# Patient Record
Sex: Male | Born: 1970 | Race: White | Hispanic: No | Smoking: Never smoker
Health system: Southern US, Academic
[De-identification: ages and names within clinical notes are randomized; demographics above are authoritative.]

## PROBLEM LIST (undated history)

## (undated) DIAGNOSIS — K5792 Diverticulitis of intestine, part unspecified, without perforation or abscess without bleeding: Secondary | ICD-10-CM

## (undated) DIAGNOSIS — K859 Acute pancreatitis without necrosis or infection, unspecified: Secondary | ICD-10-CM

## (undated) DIAGNOSIS — I10 Essential (primary) hypertension: Secondary | ICD-10-CM

## (undated) DIAGNOSIS — E119 Type 2 diabetes mellitus without complications: Secondary | ICD-10-CM

---

## 1992-04-13 ENCOUNTER — Other Ambulatory Visit (HOSPITAL_COMMUNITY): Payer: Self-pay | Admitting: EXTERNAL

## 2021-02-16 IMAGING — CR XRAY SHOULDER MINIMUM 2 VIEW LT
1 series · 3 of 3 positions shown · non-contrast
Comparison: None available.

﻿EXAM:  XRAY SHOULDER MINIMUM 2 VIEW LT
INDICATION: Pain.

[Series 1: view not recorded · 0.17mm/px · 3 of 3 slices shown]
[im 1/3]
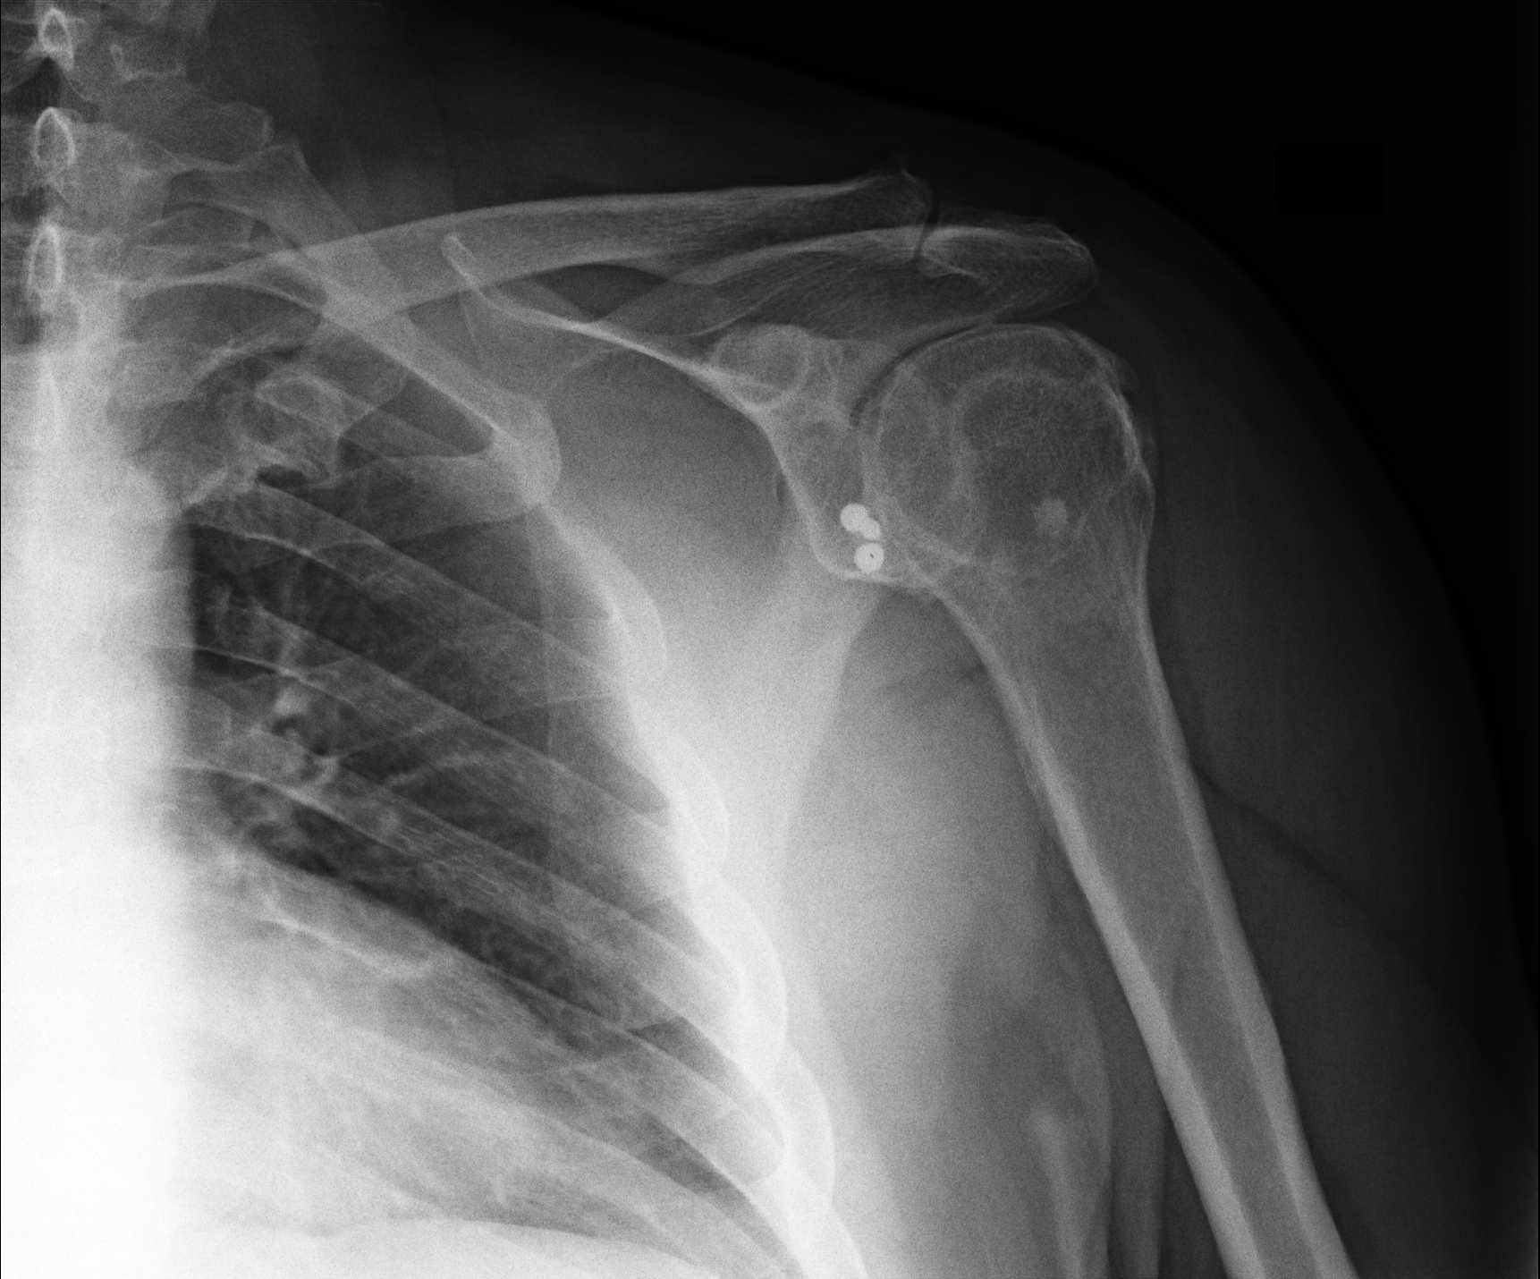
[im 2/3]
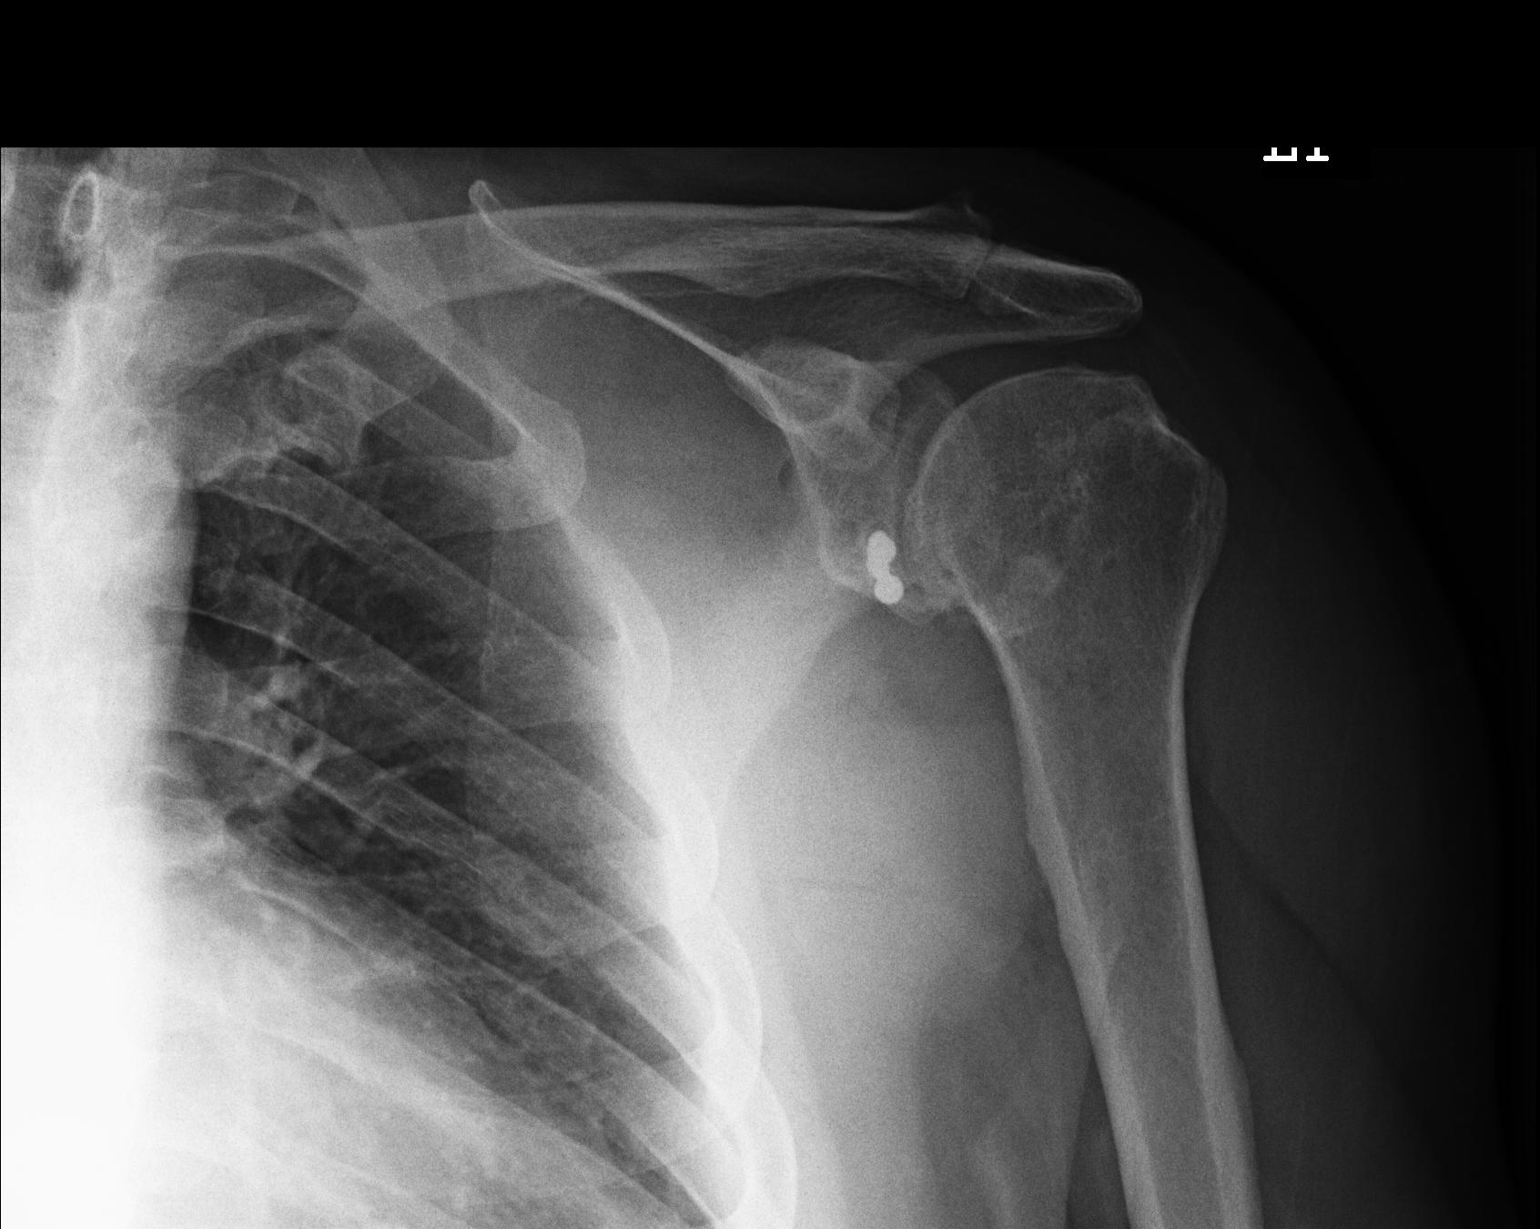
[im 3/3]
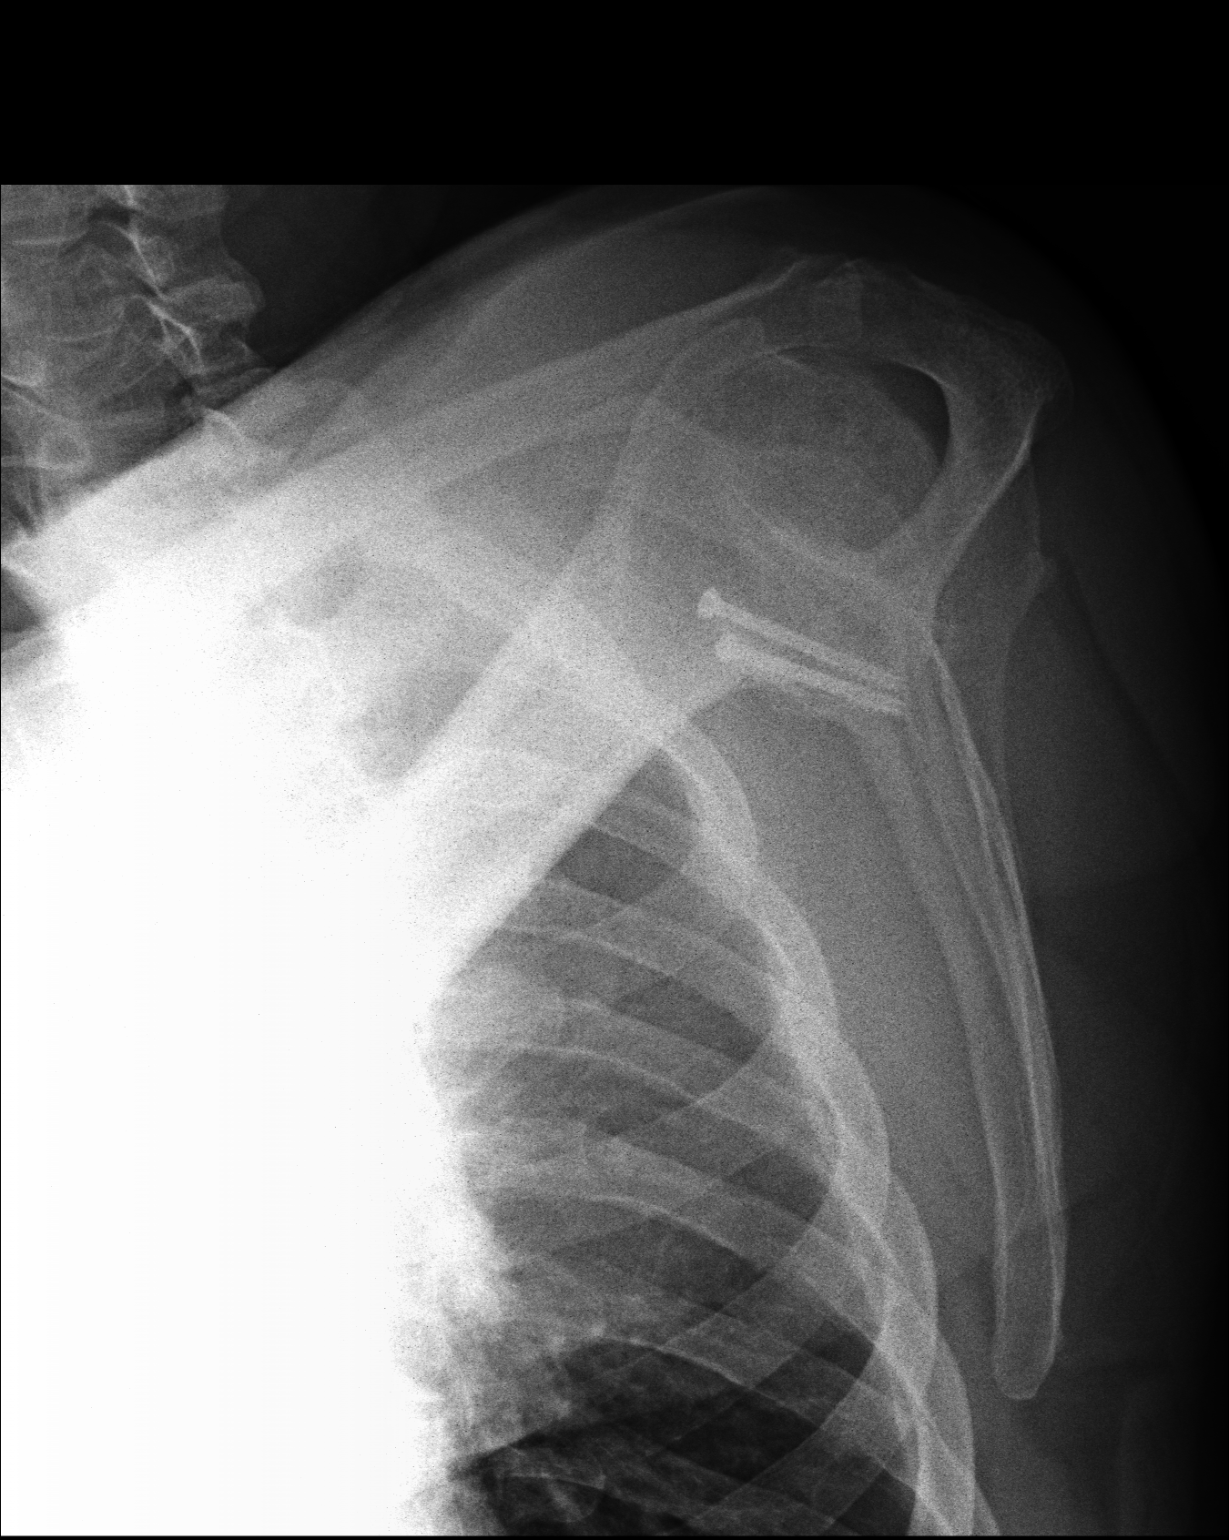

[3 of 3 positions shown; findings below may reference images not displayed]

FINDINGS: There is no acute fracture or subluxation. There is advanced glenohumeral and acromioclavicular joint osteoarthritis. Surgical anchors are also noted within the inferior glenoid. Visualized lung is clear. Surrounding soft tissues are unremarkable.
IMPRESSION: Advanced degenerative and postsurgical changes, no acute osseous abnormality.

## 2021-02-16 IMAGING — CR XRAY CERVICAL SPINE MINIMUM 4 VIEWS
1 series · 5 of 5 positions shown · non-contrast
Comparison: None available.

﻿EXAM:  [id number]      XRAY CERVICAL SPINE MINIMUM 4 VIEWS
INDICATION: Neck pain.

[Series 1: view not recorded · 0.17mm/px · 5 of 5 slices shown]
[im 1/5]
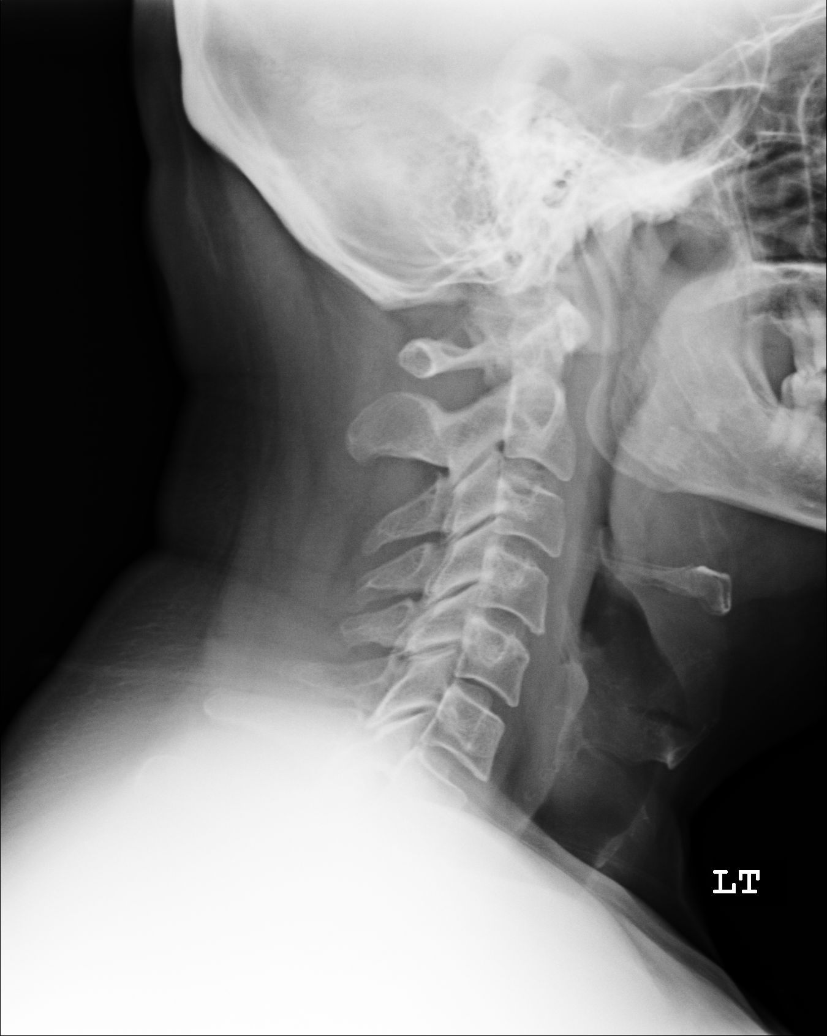
[im 2/5]
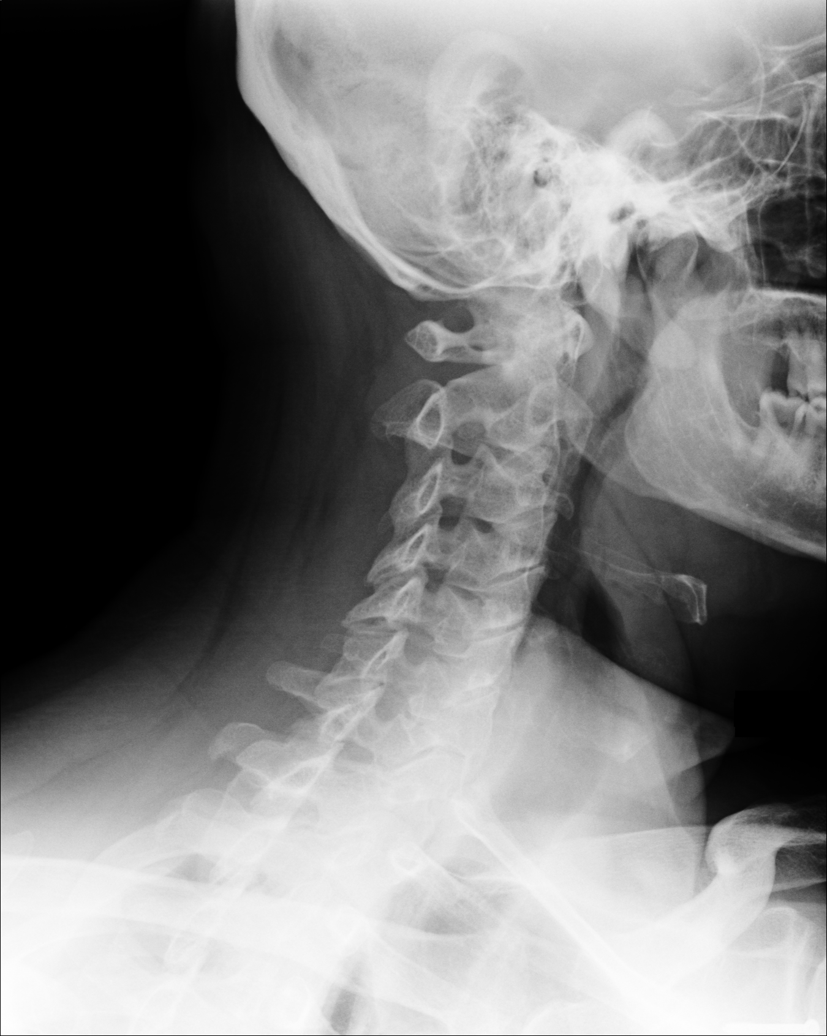
[im 3/5]
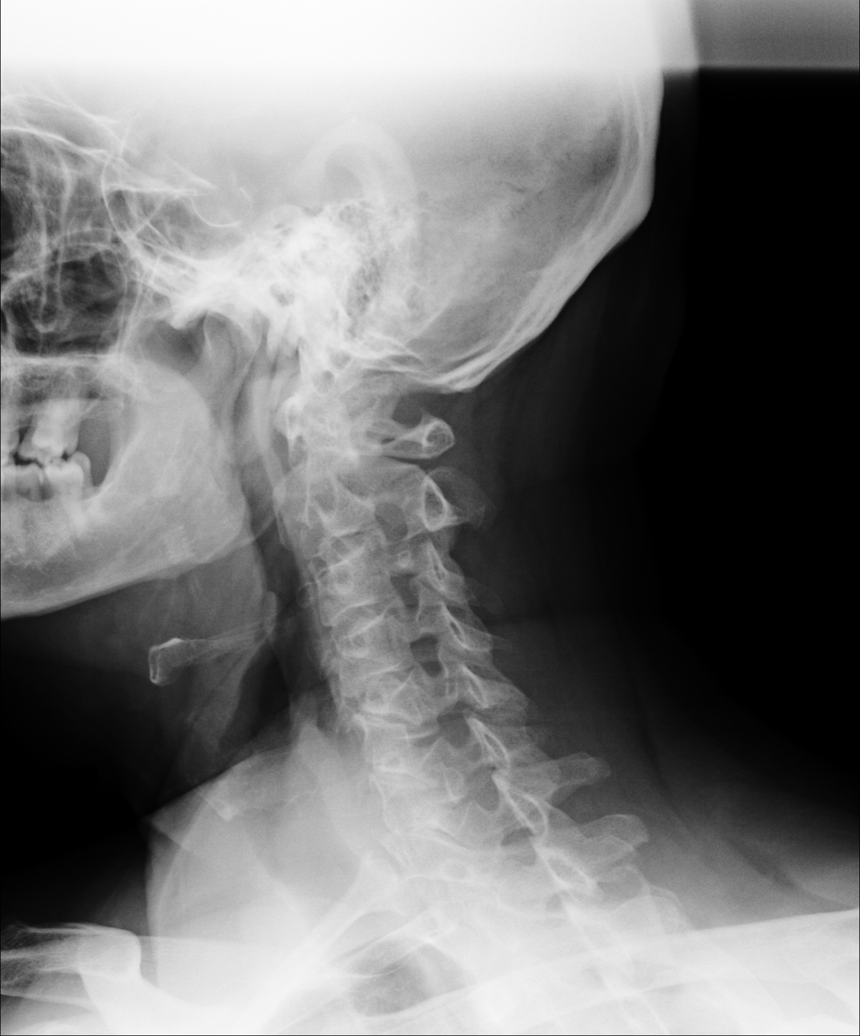
[im 4/5]
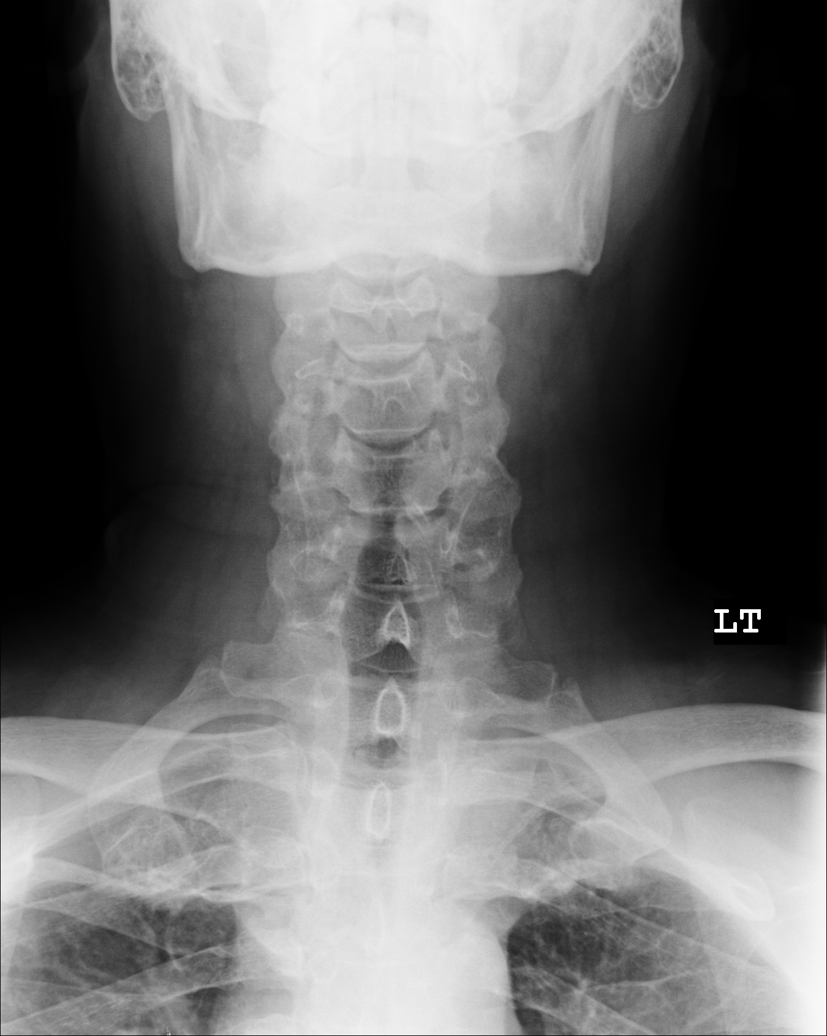
[im 5/5]
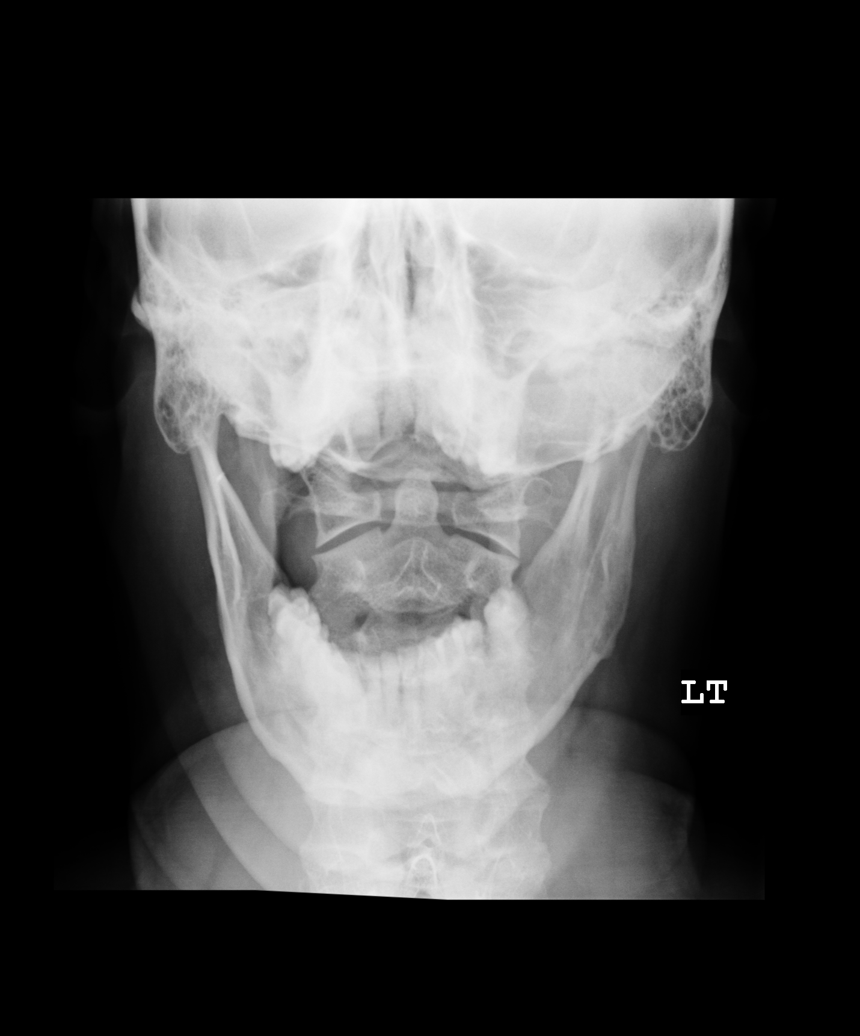

[5 of 5 positions shown; findings below may reference images not displayed]

FINDINGS: Straightening of cervical lordosis is likely positional. There is no acute fracture or subluxation. No significant degenerative disc disease or neural foraminal stenosis is seen at any level. Atlantoaxial articulation is also well maintained. There is no prevertebral soft tissue swelling.
IMPRESSION: Unremarkable exam.

## 2021-06-20 IMAGING — MR MRI LUMBAR SPINE WITHOUT CONTRAST
5 of 6 series · 32 of 48 positions shown · IV contrast (gadolinium)
Comparison: None available.

﻿EXAM:  [id number]   MRI LUMBAR SPINE WITHOUT CONTRAST
INDICATION: Lower back pain with left lower extremity radiculopathy.
TECHNIQUE: Multiplanar multisequential MRI of the lumbar spine was performed without gadolinium contrast.

[Series 5: T2 · sagittal · 4.0mm · 0.87mm/px · 6 of 13 slices shown (1 of 3)]
[im 1/13]
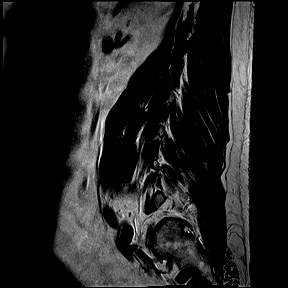
[im 3/13]
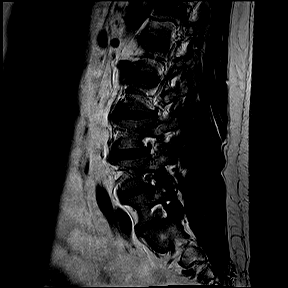
[im 5/13]
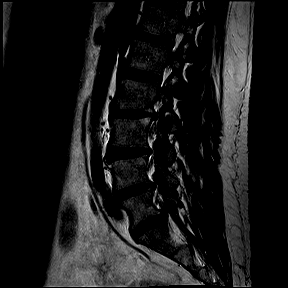
[im 8/13]
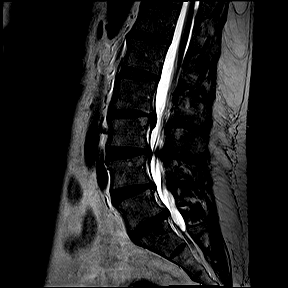
[im 10/13]
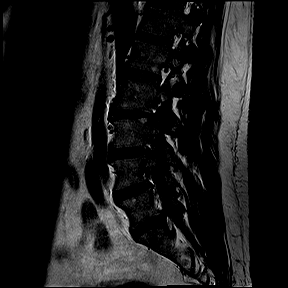
[im 13/13]
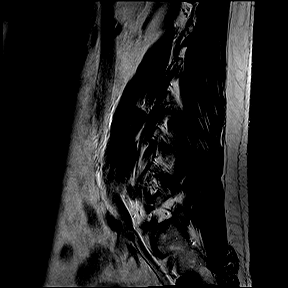

[Series 6: T1 · sagittal · 4.0mm · 0.87mm/px · 6 of 13 slices shown (1 of 2)]
[im 1/13]
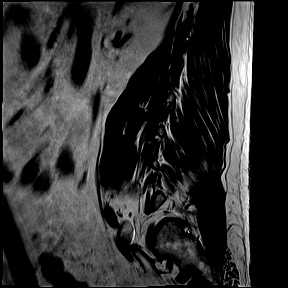
[im 3/13]
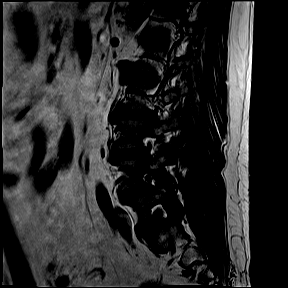
[im 5/13]
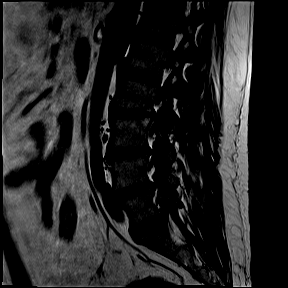
[im 8/13]
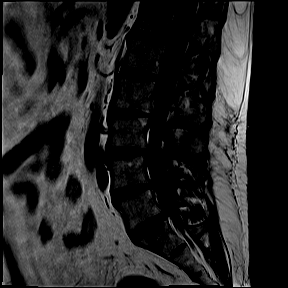
[im 10/13]
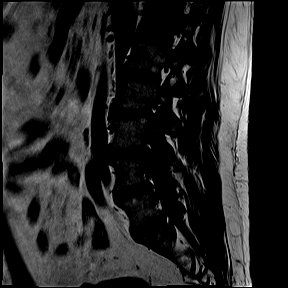
[im 13/13]
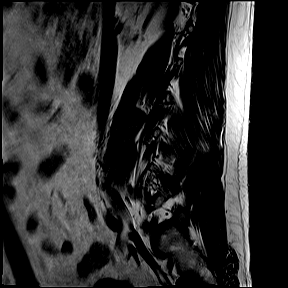

[Series 8: T2 · axial · 4.0mm · 0.52mm/px · z∈[-143,+64]mm · 11 of 23 slices shown (2 of 3)]
[im 1/23]
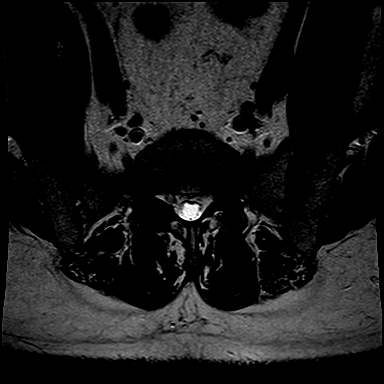
[im 3/23]
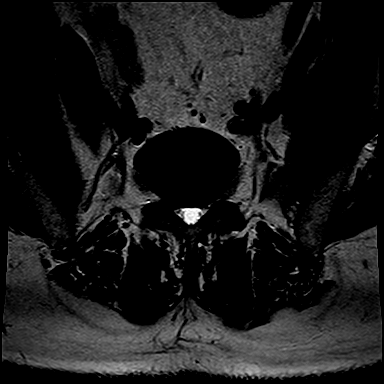
[im 5/23]
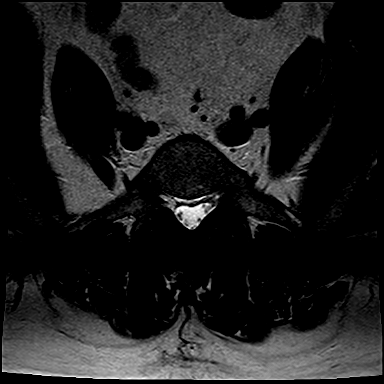
[im 7/23]
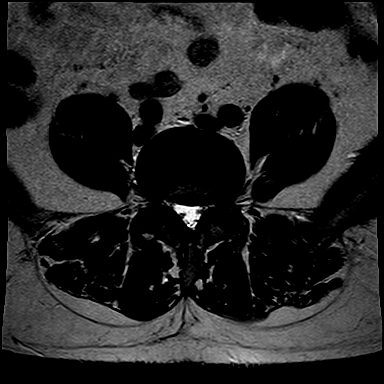
[im 9/23]
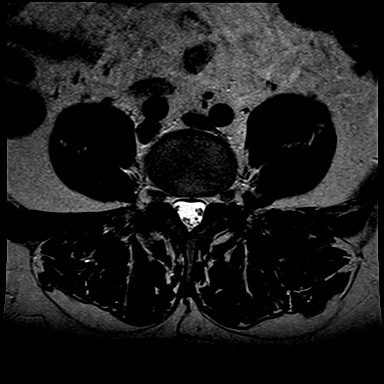
[im 12/23]
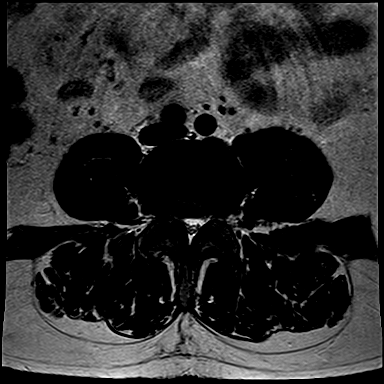
[im 14/23]
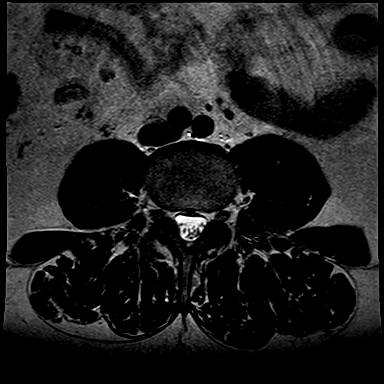
[im 16/23]
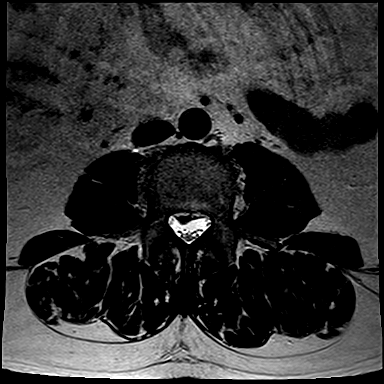
[im 18/23]
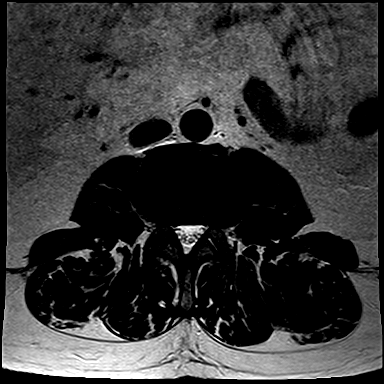
[im 20/23]
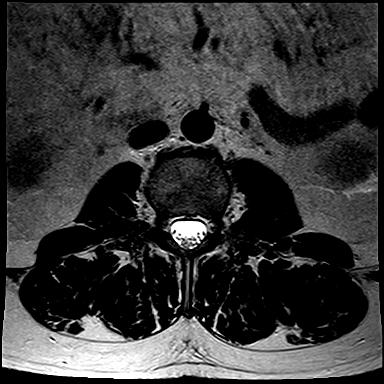
[im 23/23]
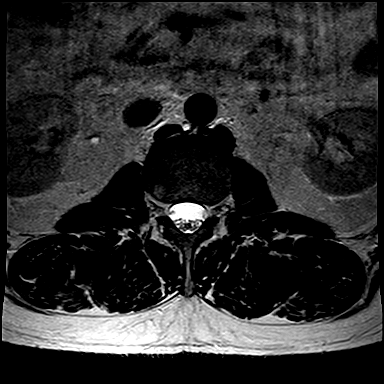

[Series 9: T1 · axial · 4.0mm · 0.52mm/px · 1 of 23 slices shown (2 of 2)]
[im 1/23]
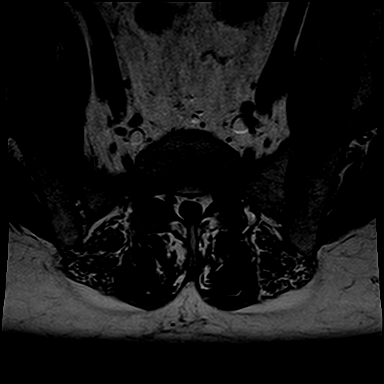

[Series 10: T2 · coronal · 5.0mm · 0.82mm/px · 8 of 18 slices shown (3 of 3)]
[im 1/18]
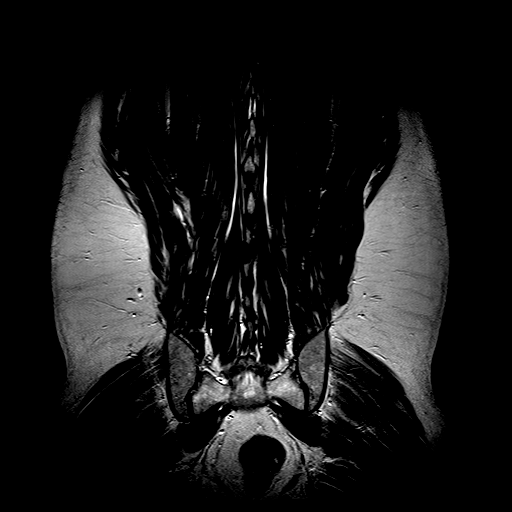
[im 3/18]
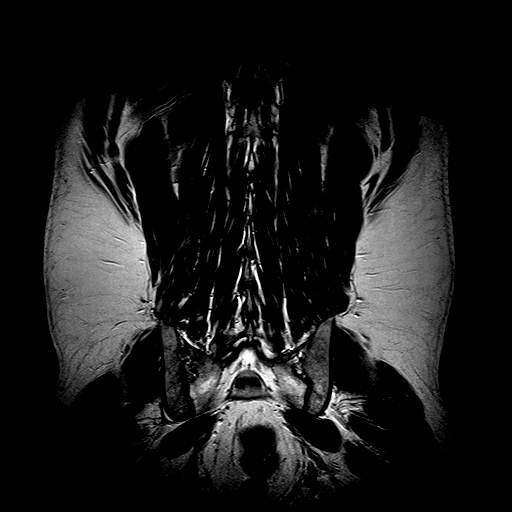
[im 5/18]
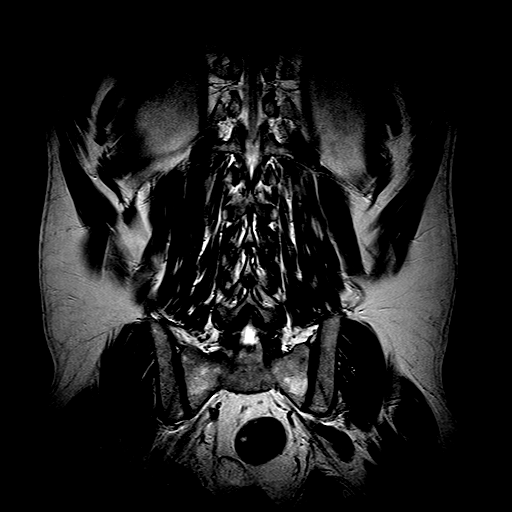
[im 8/18]
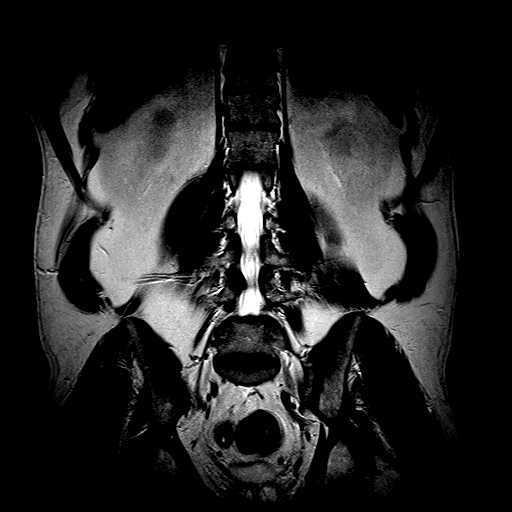
[im 10/18]
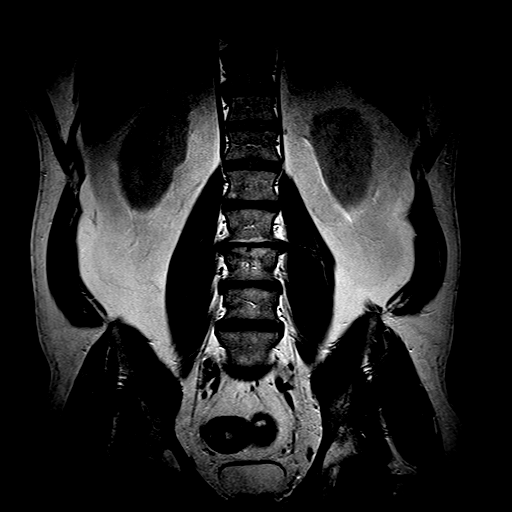
[im 13/18]
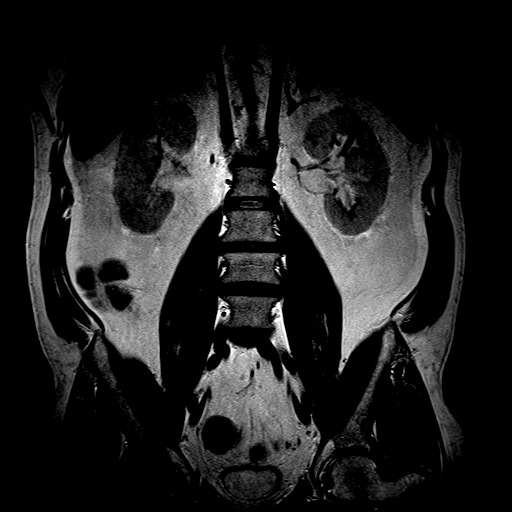
[im 15/18]
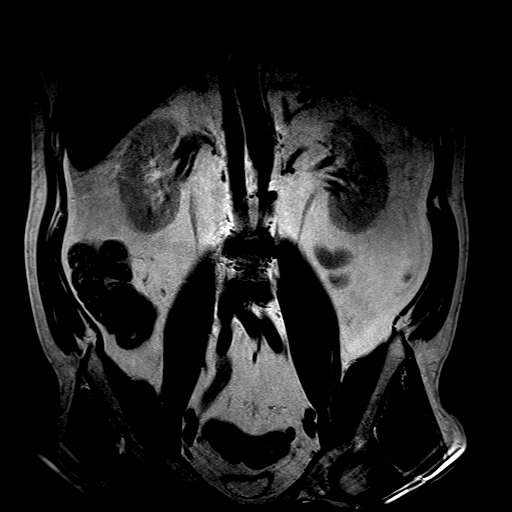
[im 18/18]
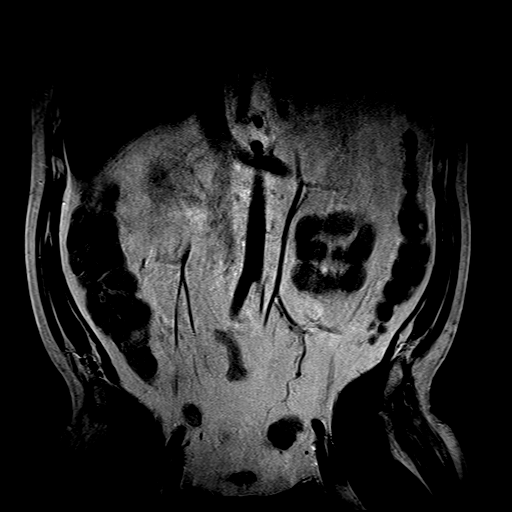

[32 of 48 positions shown; findings below may reference images not displayed]

FINDINGS: Vertebral bodies are normal in height, alignment and signal intensity. There is no acute fracture or subluxation. Distal spinal cord is normal in signal intensity and terminates normally at L1 vertebral body level. Spinal canal is congenitally narrow.

L1-2 level is unremarkable.

At L2-3 level, there is a small to moderate broad-based central and right paracentral disc bulge resulting in moderate to severe spinal stenosis. There is moderate bilateral neural foraminal stenosis from facet arthropathy and bulging annulus.

At L3-4 level, there is a small broad-based central disc bulge resulting in severe spinal stenosis. There is moderate bilateral neural foraminal stenosis from facet arthropathy and bulging annulus.

At L4-5 level, there is a small broad-based central disc bulge, mildly effacing the ventral thecal sac. There is moderate bilateral neural foraminal stenosis from facet arthropathy and bulging annulus.

At L5-S1 level, there is a small broad-based central disc bulge, mildly effacing the ventral thecal sac. There is moderate to severe left and moderate right neural foraminal stenosis from facet arthropathy and bulging annulus.

Paraspinal soft tissues are unremarkable.
IMPRESSION: 1. Severe spinal stenosis at L3-4 level from a small central disc bulge. 

2. Moderate to severe spinal stenosis at L2-3 level from a small to moderate central and right paracentral disc bulge. 

3. Multilevel neural foraminal stenosis as detailed above.

## 2022-04-24 IMAGING — MR MRI LUMBAR SPINE WITHOUT CONTRAST
5 of 6 series · 32 of 48 positions shown · non-contrast
Comparison: None available.

﻿EXAM:  [id number]   MRI LUMBAR SPINE WITHOUT CONTRAST
INDICATION: Low back pain radiating down left leg.
TECHNIQUE: Noncontrast multiplanar, multisequence MRI was performed.

[Series 5: T2 · sagittal · 4.0mm · 0.94mm/px · 5 of 13 slices shown (1 of 3)]
[im 1/13]
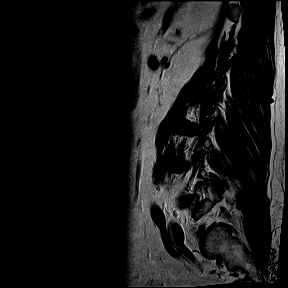
[im 4/13]
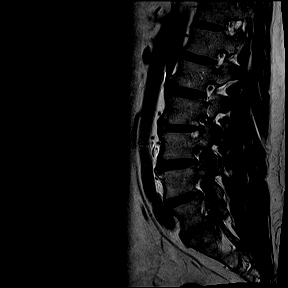
[im 7/13]
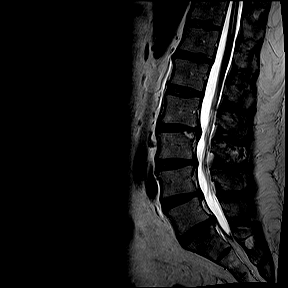
[im 10/13]
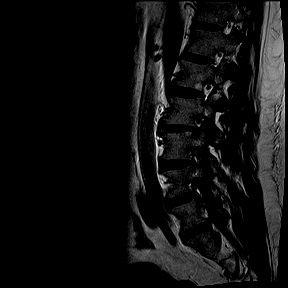
[im 13/13]
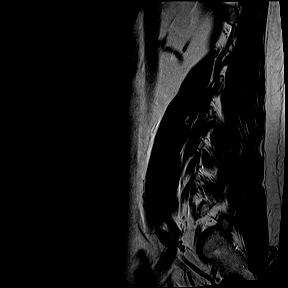

[Series 6: T1 · sagittal · 4.0mm · 0.94mm/px · 6 of 13 slices shown (1 of 2)]
[im 1/13]
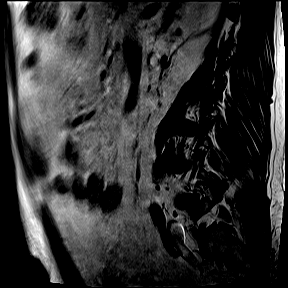
[im 3/13]
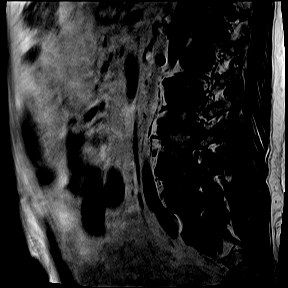
[im 5/13]
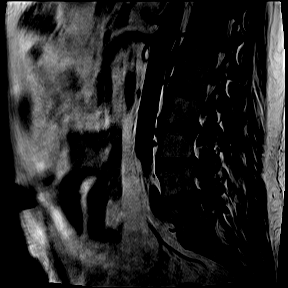
[im 8/13]
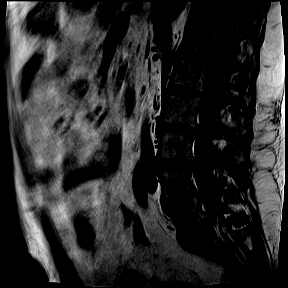
[im 10/13]
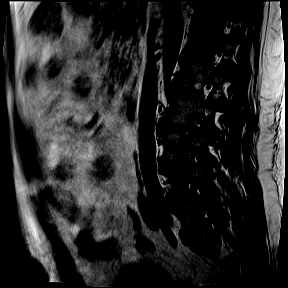
[im 13/13]
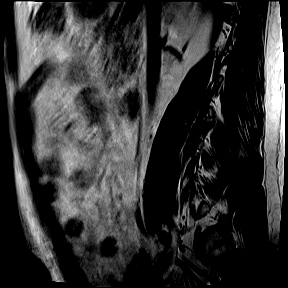

[Series 8: T2 · axial · 4.0mm · 0.52mm/px · z∈[-156,+43]mm · 11 of 23 slices shown (2 of 3)]
[im 1/23]
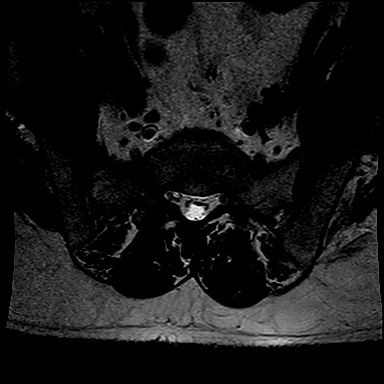
[im 3/23]
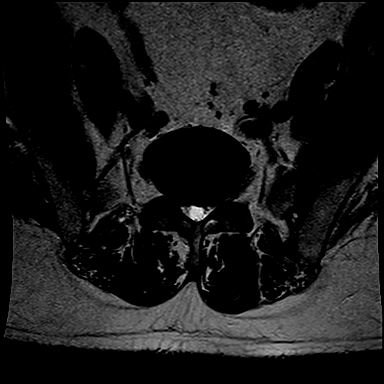
[im 5/23]
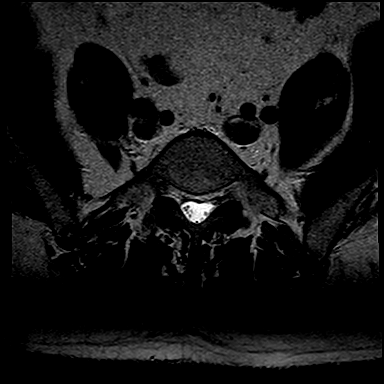
[im 7/23]
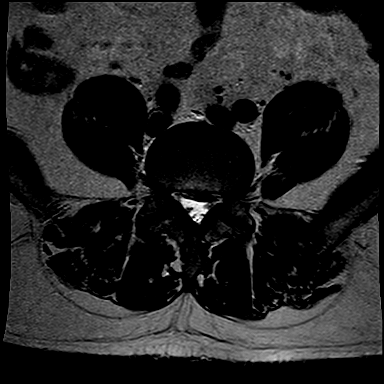
[im 9/23]
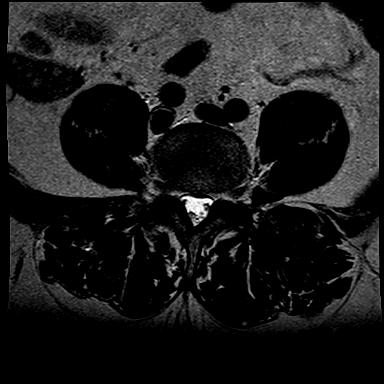
[im 12/23]
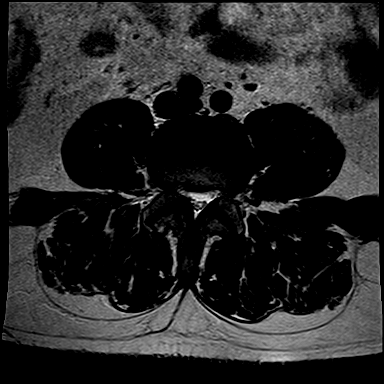
[im 14/23]
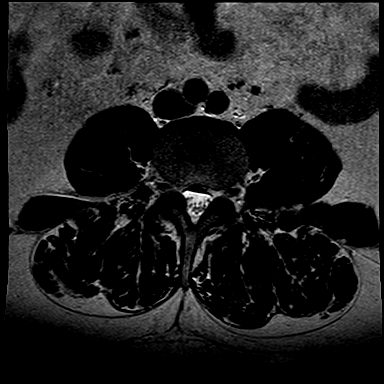
[im 16/23]
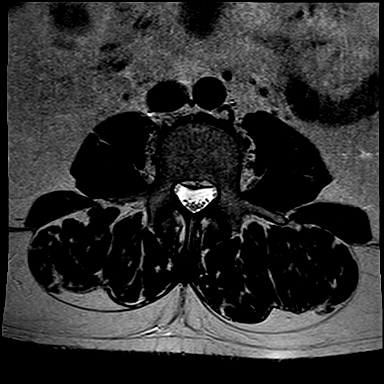
[im 18/23]
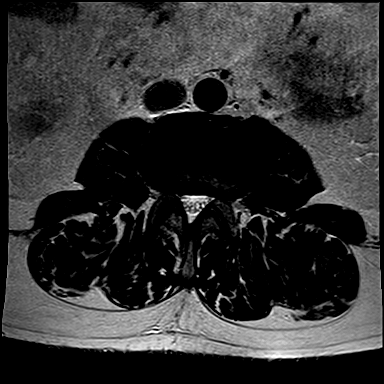
[im 20/23]
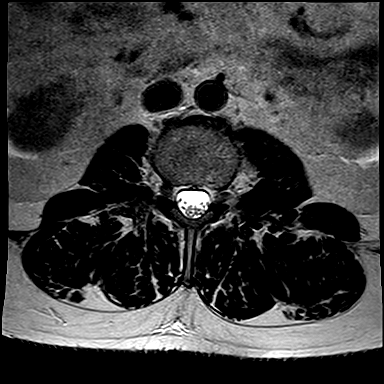
[im 23/23]
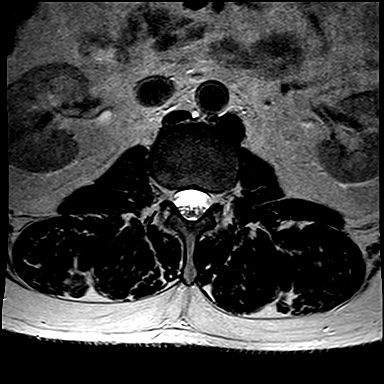

[Series 9: T1 · axial · 4.0mm · 0.52mm/px · 1 of 23 slices shown (2 of 2)]
[im 1/23]
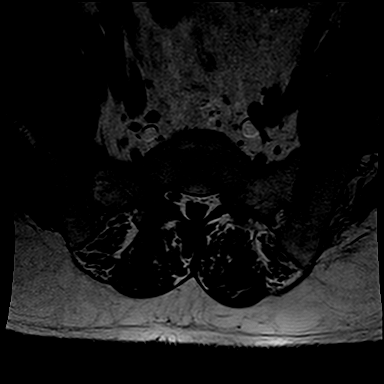

[Series 10: T2 · coronal · 5.0mm · 0.82mm/px · 9 of 20 slices shown (3 of 3)]
[im 1/20]
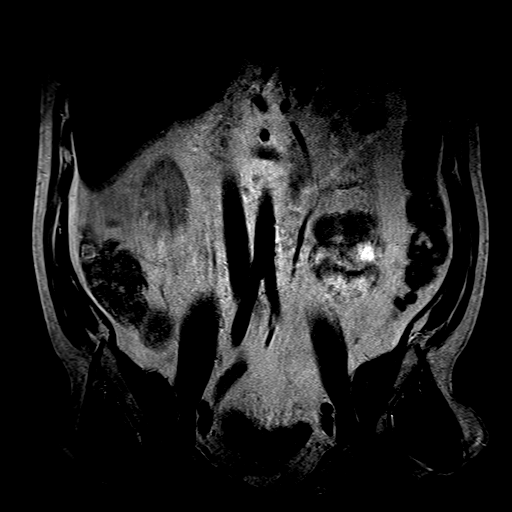
[im 3/20]
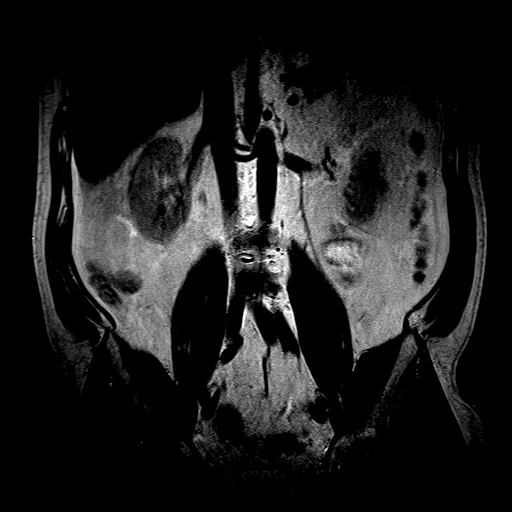
[im 5/20]
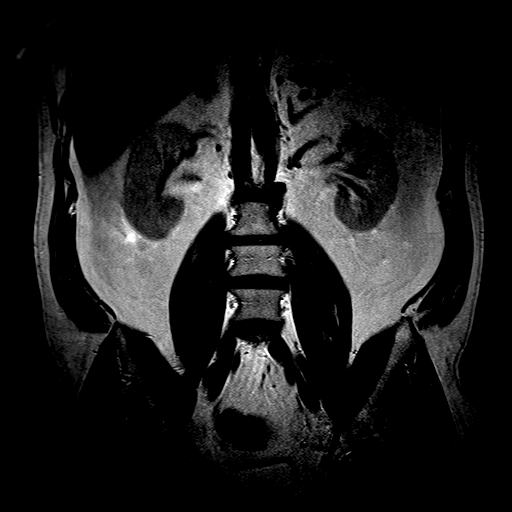
[im 8/20]
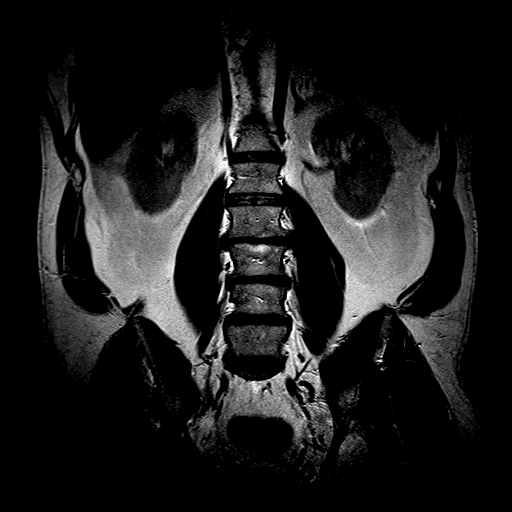
[im 10/20]
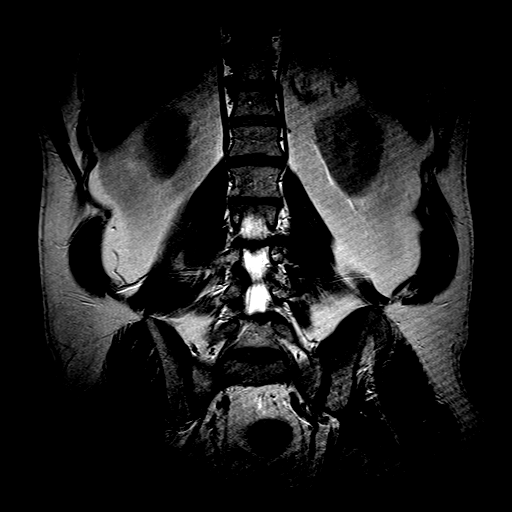
[im 12/20]
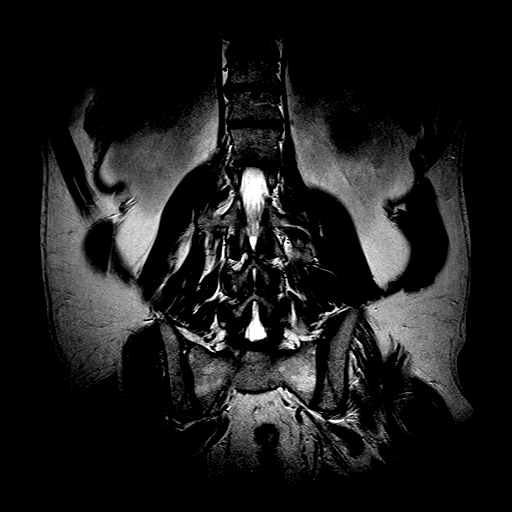
[im 15/20]
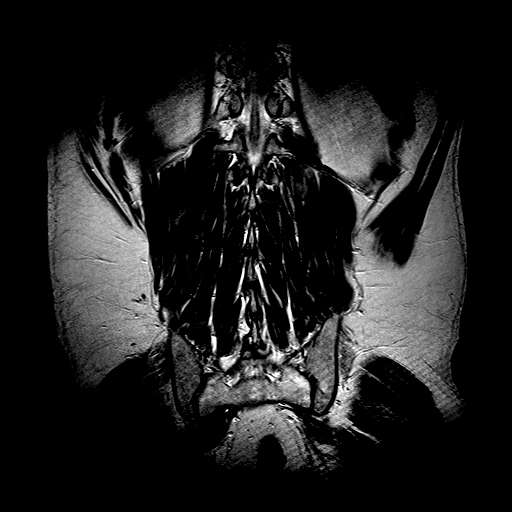
[im 17/20]
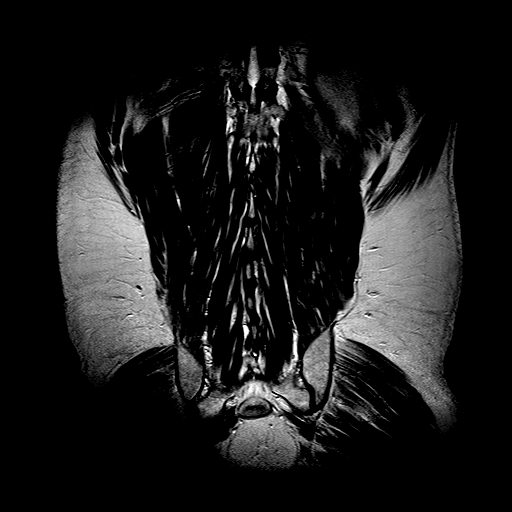
[im 20/20]
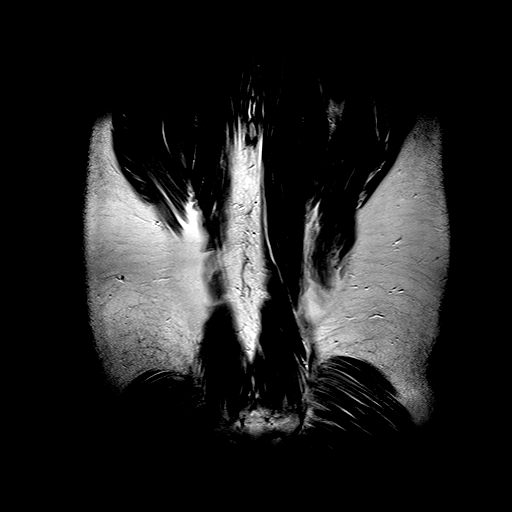

[32 of 48 positions shown; findings below may reference images not displayed]

FINDINGS: there are mild degenerative changes at multiple levels.  There is minor degenerative marrow signal alteration noted at L2-L3.  There is no fracture or malalignment.  The conus appears unremarkable.  

L1-L2 appears unremarkable.  

At L2-L3, there is a small, central disc herniation with associated mild spinal stenosis.  

At L3-L4, there is mild disc bulging and moderate spinal stenosis.  

At L4-L5, there is a small disc herniation and mild spinal stenosis.  

At L5-S1, there is moderate disc bulging and mild spinal stenosis.
IMPRESSION: 1. Mild to moderate multilevel spinal stenosis.

2. Small disc herniations and mild to moderate disc bulging as described above.

## 2022-04-27 ENCOUNTER — Emergency Department
Admission: EM | Admit: 2022-04-27 | Discharge: 2022-04-27 | Disposition: A | Discharge status: Home / Self Care | Payer: Self-pay | Attending: Physician Assistant | Admitting: Physician Assistant

## 2022-04-27 ENCOUNTER — Other Ambulatory Visit: Payer: Self-pay

## 2022-04-27 ENCOUNTER — Encounter (HOSPITAL_BASED_OUTPATIENT_CLINIC_OR_DEPARTMENT_OTHER): Payer: Self-pay

## 2022-04-27 ENCOUNTER — Emergency Department
Admission: EM | Admit: 2022-04-27 | Discharge: 2022-04-27 | Discharge status: Against Medical Advice | Payer: BC Managed Care – PPO

## 2022-04-27 DIAGNOSIS — M549 Dorsalgia, unspecified: Secondary | ICD-10-CM

## 2022-04-27 DIAGNOSIS — M5136 Other intervertebral disc degeneration, lumbar region: Secondary | ICD-10-CM | POA: Insufficient documentation

## 2022-04-27 DIAGNOSIS — G8929 Other chronic pain: Secondary | ICD-10-CM | POA: Insufficient documentation

## 2022-04-27 DIAGNOSIS — Z5321 Procedure and treatment not carried out due to patient leaving prior to being seen by health care provider: Secondary | ICD-10-CM | POA: Insufficient documentation

## 2022-04-27 HISTORY — DX: Essential (primary) hypertension: I10

## 2022-04-27 HISTORY — DX: Type 2 diabetes mellitus without complications: E11.9

## 2022-04-27 MED ORDER — HYDROCODONE 5 MG-ACETAMINOPHEN 325 MG TABLET
ORAL_TABLET | ORAL | Status: AC
Start: 2022-04-27 — End: 2022-04-27
  Filled 2022-04-27: qty 1

## 2022-04-27 MED ORDER — KETOROLAC 60 MG/2 ML INTRAMUSCULAR SOLUTION
60.0000 mg | INTRAMUSCULAR | Status: AC
Start: 2022-04-27 — End: 2022-04-27
  Administered 2022-04-27: 60 mg via INTRAMUSCULAR

## 2022-04-27 MED ORDER — CYCLOBENZAPRINE 10 MG TABLET
10.0000 mg | ORAL_TABLET | Freq: Three times a day (TID) | ORAL | 0 refills | Status: DC | PRN
Start: 2022-04-27 — End: 2024-02-03

## 2022-04-27 MED ORDER — CYCLOBENZAPRINE 10 MG TABLET
ORAL_TABLET | ORAL | Status: AC
Start: 2022-04-27 — End: 2022-04-27
  Filled 2022-04-27: qty 1

## 2022-04-27 MED ORDER — DEXAMETHASONE SODIUM PHOSPHATE (PF) 10 MG/ML INJECTION SOLUTION
INTRAMUSCULAR | Status: AC
Start: 2022-04-27 — End: 2022-04-27
  Filled 2022-04-27: qty 1

## 2022-04-27 MED ORDER — KETOROLAC 60 MG/2 ML INTRAMUSCULAR SOLUTION
INTRAMUSCULAR | Status: AC
Start: 2022-04-27 — End: 2022-04-27
  Filled 2022-04-27: qty 2

## 2022-04-27 MED ORDER — DEXAMETHASONE SODIUM PHOSPHATE (PF) 10 MG/ML INJECTION SOLUTION
5.0000 mg | INTRAMUSCULAR | Status: AC
Start: 2022-04-27 — End: 2022-04-27
  Administered 2022-04-27: 5 mg via INTRAMUSCULAR

## 2022-04-27 MED ORDER — CYCLOBENZAPRINE 10 MG TABLET
10.0000 mg | ORAL_TABLET | ORAL | Status: AC
Start: 2022-04-27 — End: 2022-04-27
  Administered 2022-04-27: 10 mg via ORAL

## 2022-04-27 MED ORDER — HYDROCODONE 5 MG-ACETAMINOPHEN 325 MG TABLET
1.0000 | ORAL_TABLET | Freq: Three times a day (TID) | ORAL | 0 refills | Status: AC | PRN
Start: 2022-04-27 — End: 2022-04-30

## 2022-04-27 MED ORDER — HYDROCODONE 5 MG-ACETAMINOPHEN 325 MG TABLET
2.0000 | ORAL_TABLET | ORAL | Status: AC
Start: 2022-04-27 — End: 2022-04-27
  Administered 2022-04-27: 2 via ORAL

## 2022-04-27 NOTE — ED Provider Notes (Signed)
[facility], [facility] Emergency Department  Primary Provider Note      CHIEF COMPLAINT  Chief Complaint   Patient presents with    Back Pain     HISTORY OF PRESENT ILLNESS  Mark Fischer, date of birth [birth year], is a 51 y.o. male who presented to the Emergency Department tonight with left-sided back pain that radiates down the back of his left leg to his knee.  Follows with Dr. Allena Katz for pain management in [place] is scheduled to see him on 12/06, had an MRI earlier this week.  He does have history of significant degenerative disc disease.  He denies loss of bowel or bladder control, denies saddle paresthesia.  He he reports that he has been unable to sleep for the last 3 days due to the pain.      PAST MEDICAL/SURGICAL/FAMILY/SOCIAL HISTORY  Past Medical History:   Diagnosis Date    Diabetes mellitus (CMS HCC)     Essential hypertension        Family Medical History:    None       Social History     Socioeconomic History    Marital status: Divorced   Tobacco Use    Smoking status: Never    Smokeless tobacco: Never   Substance and Sexual Activity    Alcohol use: Never    Drug use: Never      ALLERGIES  No Known Allergies    PHYSICAL EXAM  VITAL SIGNS:  Filed Vitals:    04/27/22 1856 04/27/22 2145   BP: (!) 158/113 (!) 152/92   Pulse: 74 74   Resp: 19 18   Temp: 36.7 C (98.1 F)    SpO2: 98% 95%       General:  Uncomfortable appearing male patient  HENT:  Head: Normocephalic and atraumatic.  Mouth/Throat: Oropharynx is clear and moist. Uvula midline.   Eyes: EOMI, PERRL. normal conjunctiva without injection, lids without swelling or exudate  Ears: Hearing grossly intact.   Neck: Trachea midline. Neck supple.  Cardiovascular: RRR.    Respiratory: CTAB, normal work of breathing w/o retractions or accessory muscle accessory muscle use  Abdominal: Bowel sounds present and normal. Abdomen soft, non-tender, non-distended, no rebound and no guarding.  Back:  Left paraspinal tenderness.  GU:  Deferred  Rectal: Deferred  Skin: Warm and dry.   Psychiatric: normal mood and affect. Behavior is normal.  Neurological: A&O x3, no acute deficit or focal weakness. Moves all extremities.    DIAGNOSTICS  Labs:  Labs listed below were reviewed and interpreted by me.  No results found for any visits on 04/27/22.  Radiology:       ED COURSE/MEDICAL DECISION MAKING  Medications Administered in the ED   HYDROcodone-acetaminophen (NORCO) 5-325 mg per tablet (2 Tablets Oral Given 04/27/22 2142)   cyclobenzaprine (FLEXERIL) tablet (10 mg Oral Given 04/27/22 2138)   ketorolac (TORADOL) 60mg /2 mL IM injection (60 mg IntraMUSCULAR Given 04/27/22 2138)   dexAMETHasone (PF) 10 mg/mL injection (5 mg IntraMUSCULAR Given 04/27/22 2138)          Medical Decision Making  Patient is a 51 year old male who presented to the Emergency Department tonight with left-sided back pain that radiates down the back of his left leg to his knee.  Follows with Dr. 44 for pain management in [place] is scheduled to see him on 12/06, had an MRI earlier this week.  He does have history of significant degenerative disc disease.  He denies loss of bowel  or bladder control, denies saddle paresthesia.  He he reports that he has been unable to sleep for the last 3 days due to the pain.  Patient was treated with Toradol, Flexeril, Norco and dexamethasone with improvement of symptoms.  He was prescribed Norco.  Prescription drug monitoring system was access, patient was prescribed ten oxycodone March 2023.  He was encouraged to follow up with his PCP, Dr. Allena Katz as soon as possible.  All questions were answered prior to discharge.    Problems Addressed:  Chronic back pain, unspecified back location, unspecified back pain laterality: acute illness or injury  Degenerative disc disease, lumbar: acute illness or injury    Risk  Prescription drug management.      CLINICAL IMPRESSION  Clinical Impression   Degenerative disc disease, lumbar (Primary)   Chronic  back pain, unspecified back location, unspecified back pain laterality     DISPOSITION  Discharged       DISCHARGE MEDICATIONS  Discharge Medication List as of 04/27/2022 10:21 PM        START taking these medications    Details   cyclobenzaprine (FLEXERIL) 10 mg Oral Tablet Take 1 Tablet (10 mg total) by mouth Three times a day as needed for Muscle spasms, Disp-30 Tablet, R-0, E-Rx      HYDROcodone-acetaminophen (NORCO) 5-325 mg Oral Tablet Take 1 Tablet by mouth Every 8 hours as needed for Pain for up to 3 days, Disp-6 Tablet, R-0, E-Rx             Johny Shock, PA-C   04/27/2022, 21:13   [facility]  Department of Emergency Medicine  [facility]    This note was partially generated using MModal Fluency Direct system, and there may be some incorrect words, spellings, and punctuation that were not noted in checking the note before saving.    -----

## 2022-04-27 NOTE — ED Nurses Note (Signed)
Attempted to call patient back to triage, registration states that the patient informed her, "We will be back in a minute". Patient nowhere to be found.

## 2022-04-27 NOTE — ED Nurses Note (Signed)
Patient remains unable to be found in waiting room.

## 2022-04-27 NOTE — ED Triage Notes (Signed)
Reports having pain in lower back radiating down into left leg. MRI was performed Monday and has results. Reports surgery to be scheduled soon. Pain x3 days unable to sleep. "4 discs in my back with no more cushion pushing into nerves".

## 2022-04-27 NOTE — Discharge Instructions (Addendum)
Drink plenty of fluids. Medications as prescribed. Continue at home medications as previously prescribed. Muscle rubs like Bengay, Biofreeze, etc, heating pads, warm showers may help. Tissue massage can help as well. Range of motion exercises. Mild physicial activity as tolerated. May lift up to 25 pounds routinely and up to 40 pounds occassionally until symptoms improve. Follow up with your regular physician in next 4-5 days. If symptoms persist than further evaluation by orthopedics may be useful. Return to the ED if symptoms do not improve, worsen, or new symptoms develop. Return particularly if you have any numbness to the inner parts of your thighs or you lose control of your bladder or bowel.

## 2023-11-26 ENCOUNTER — Inpatient Hospital Stay
Admission: EM | Admit: 2023-11-26 | Discharge: 2023-11-28 | DRG: 439 | Disposition: A | Discharge status: Home / Self Care | Attending: Internal Medicine | Admitting: Internal Medicine

## 2023-11-26 ENCOUNTER — Other Ambulatory Visit: Payer: Self-pay

## 2023-11-26 ENCOUNTER — Encounter (HOSPITAL_COMMUNITY): Payer: Self-pay

## 2023-11-26 ENCOUNTER — Encounter (HOSPITAL_BASED_OUTPATIENT_CLINIC_OR_DEPARTMENT_OTHER): Payer: Self-pay

## 2023-11-26 ENCOUNTER — Emergency Department (HOSPITAL_BASED_OUTPATIENT_CLINIC_OR_DEPARTMENT_OTHER)

## 2023-11-26 ENCOUNTER — Emergency Department
Admission: EM | Admit: 2023-11-26 | Discharge: 2023-11-26 | Disposition: A | Discharge status: Home / Self Care | Source: Home / Self Care | Attending: Emergency Medicine | Admitting: Emergency Medicine

## 2023-11-26 DIAGNOSIS — N200 Calculus of kidney: Secondary | ICD-10-CM | POA: Insufficient documentation

## 2023-11-26 DIAGNOSIS — K859 Acute pancreatitis without necrosis or infection, unspecified: Principal | ICD-10-CM | POA: Diagnosis present

## 2023-11-26 DIAGNOSIS — E1122 Type 2 diabetes mellitus with diabetic chronic kidney disease: Secondary | ICD-10-CM | POA: Diagnosis present

## 2023-11-26 DIAGNOSIS — Z7984 Long term (current) use of oral hypoglycemic drugs: Secondary | ICD-10-CM

## 2023-11-26 DIAGNOSIS — K5730 Diverticulosis of large intestine without perforation or abscess without bleeding: Secondary | ICD-10-CM | POA: Insufficient documentation

## 2023-11-26 DIAGNOSIS — Z79899 Other long term (current) drug therapy: Secondary | ICD-10-CM

## 2023-11-26 DIAGNOSIS — E1165 Type 2 diabetes mellitus with hyperglycemia: Secondary | ICD-10-CM | POA: Diagnosis present

## 2023-11-26 DIAGNOSIS — N179 Acute kidney failure, unspecified: Secondary | ICD-10-CM | POA: Diagnosis present

## 2023-11-26 DIAGNOSIS — I129 Hypertensive chronic kidney disease with stage 1 through stage 4 chronic kidney disease, or unspecified chronic kidney disease: Secondary | ICD-10-CM | POA: Diagnosis present

## 2023-11-26 DIAGNOSIS — E860 Dehydration: Secondary | ICD-10-CM | POA: Insufficient documentation

## 2023-11-26 DIAGNOSIS — R112 Nausea with vomiting, unspecified: Secondary | ICD-10-CM

## 2023-11-26 DIAGNOSIS — K5790 Diverticulosis of intestine, part unspecified, without perforation or abscess without bleeding: Secondary | ICD-10-CM

## 2023-11-26 DIAGNOSIS — I491 Atrial premature depolarization: Secondary | ICD-10-CM

## 2023-11-26 HISTORY — DX: Diverticulitis of intestine, part unspecified, without perforation or abscess without bleeding: K57.92

## 2023-11-26 HISTORY — DX: Acute pancreatitis without necrosis or infection, unspecified: K85.90

## 2023-11-26 LAB — COMPREHENSIVE METABOLIC PANEL, NON-FASTING
ALBUMIN/GLOBULIN RATIO: 0.8 (ref 0.8–1.4)
ALBUMIN/GLOBULIN RATIO: 1.2 (ref 0.8–1.4)
ALBUMIN: 3.2 g/dL — ABNORMAL LOW (ref 3.4–5.0)
ALBUMIN: 3.9 g/dL (ref 3.5–5.7)
ALKALINE PHOSPHATASE: 96 U/L (ref 46–116)
ALKALINE PHOSPHATASE: 72 U/L (ref 34–104)
ALT (SGPT): 27 U/L (ref <=78)
ALT (SGPT): 18 U/L (ref 7–52)
ANION GAP: 8 mmol/L (ref 4–13)
ANION GAP: 9 mmol/L (ref 4–13)
AST (SGOT): 20 U/L (ref 15–37)
AST (SGOT): 19 U/L (ref 13–39)
BILIRUBIN TOTAL: 0.7 mg/dL (ref 0.2–1.0)
BILIRUBIN TOTAL: 1 mg/dL (ref 0.3–1.0)
BUN/CREA RATIO: 16
BUN/CREA RATIO: 15 (ref 6–22)
BUN: 22 mg/dL — ABNORMAL HIGH (ref 7–18)
BUN: 20 mg/dL (ref 7–25)
CALCIUM, CORRECTED: 9.4 mg/dL
CALCIUM, CORRECTED: 8.9 mg/dL (ref 8.9–10.8)
CALCIUM: 8.8 mg/dL (ref 8.5–10.1)
CALCIUM: 8.8 mg/dL (ref 8.6–10.3)
CHLORIDE: 101 mmol/L (ref 98–107)
CHLORIDE: 99 mmol/L (ref 98–107)
CO2 TOTAL: 29 mmol/L (ref 21–32)
CO2 TOTAL: 25 mmol/L (ref 21–31)
CREATININE: 1.41 mg/dL — ABNORMAL HIGH (ref 0.70–1.30)
CREATININE: 1.33 mg/dL — ABNORMAL HIGH (ref 0.60–1.30)
ESTIMATED GFR: 59 mL/min/1.73mˆ2 — ABNORMAL LOW (ref >59)
ESTIMATED GFR: 64 mL/min/1.73mˆ2 (ref >59)
GLOBULIN: 3.9
GLOBULIN: 3.3 (ref 2.0–3.5)
GLUCOSE: 125 mg/dL — ABNORMAL HIGH (ref 74–106)
GLUCOSE: 224 mg/dL — ABNORMAL HIGH (ref 74–109)
OSMOLALITY, CALCULATED: 281 mosm/kg (ref 270–290)
OSMOLALITY, CALCULATED: 276 mosm/kg (ref 270–290)
POTASSIUM: 4.4 mmol/L (ref 3.5–5.1)
POTASSIUM: 3.8 mmol/L (ref 3.5–5.1)
PROTEIN TOTAL: 7.1 g/dL (ref 6.4–8.2)
PROTEIN TOTAL: 7.2 g/dL (ref 6.4–8.9)
SODIUM: 138 mmol/L (ref 136–145)
SODIUM: 133 mmol/L — ABNORMAL LOW (ref 136–145)

## 2023-11-26 LAB — URINALYSIS, MACROSCOPIC
BILIRUBIN: NEGATIVE mg/dL
BLOOD: NEGATIVE mg/dL
GLUCOSE: 70 mg/dL — AB
KETONES: NEGATIVE mg/dL
LEUKOCYTES: NEGATIVE WBCs/uL
NITRITE: NEGATIVE
PH: 5.5 (ref 5.0–9.0)
PROTEIN: 20 mg/dL
SPECIFIC GRAVITY: 1.034 — ABNORMAL HIGH (ref 1.002–1.030)
UROBILINOGEN: 2 mg/dL — AB

## 2023-11-26 LAB — URINALYSIS, MICROSCOPIC
RBCS: 3 /HPF (ref <4)
SQUAMOUS EPITHELIAL: <1 /HPF (ref <28)
WBCS: 3 /HPF (ref <6)

## 2023-11-26 LAB — URINALYSIS, MACRO/MICRO
BILIRUBIN: NEGATIVE mg/dL
BLOOD: NEGATIVE mg/dL
GLUCOSE: NEGATIVE mg/dL
KETONES: NEGATIVE mg/dL
LEUKOCYTES: NEGATIVE WBCs/uL
NITRITE: NEGATIVE
PH: 6 (ref 4.6–8.0)
PROTEIN: NEGATIVE mg/dL
SPECIFIC GRAVITY: 1.02 (ref 1.003–1.035)
UROBILINOGEN: 0.2 mg/dL (ref 0.2–1.0)

## 2023-11-26 LAB — CBC WITH DIFF
BASOPHIL #: 0.04 x10ˆ3/uL (ref 0.00–0.10)
BASOPHIL #: 0 x10ˆ3/uL (ref 0.00–0.10)
BASOPHIL %: 0 % (ref 0–1)
BASOPHIL %: 0 % (ref 0–1)
EOSINOPHIL #: 0.12 x10ˆ3/uL (ref 0.00–0.60)
EOSINOPHIL #: 0.1 x10ˆ3/uL (ref 0.00–0.60)
EOSINOPHIL %: 1 % (ref 1–8)
EOSINOPHIL %: 1 % (ref 1–8)
HCT: 41.8 % (ref 36.7–47.1)
HCT: 40.4 % (ref 36.7–47.1)
HGB: 14.1 g/dL (ref 12.5–16.3)
HGB: 14.1 g/dL (ref 12.5–16.3)
LYMPHOCYTE #: 1.53 x10ˆ3/uL (ref 1.00–3.00)
LYMPHOCYTE #: 1.7 x10ˆ3/uL (ref 1.00–3.00)
LYMPHOCYTE %: 13 % — ABNORMAL LOW (ref 15–43)
LYMPHOCYTE %: 14 % — ABNORMAL LOW (ref 15–43)
MCH: 30.2 pg (ref 23.8–33.4)
MCH: 29.7 pg (ref 23.8–33.4)
MCHC: 33.6 g/dL (ref 32.5–36.3)
MCHC: 34.8 g/dL (ref 32.5–36.3)
MCV: 89.9 fL (ref 73.0–96.2)
MCV: 85.4 fL (ref 73.0–96.2)
MONOCYTE #: 1.01 x10ˆ3/uL — ABNORMAL HIGH (ref 0.30–1.00)
MONOCYTE #: 1.4 x10ˆ3/uL — ABNORMAL HIGH (ref 0.30–1.10)
MONOCYTE %: 9 % (ref 6–14)
MONOCYTE %: 11 % (ref 6–14)
MPV: 9.4 fL (ref 7.4–11.4)
MPV: 9.5 fL (ref 7.4–11.4)
NEUTROPHIL #: 8.97 x10ˆ3/uL — ABNORMAL HIGH (ref 1.85–7.84)
NEUTROPHIL #: 9.1 x10ˆ3/uL — ABNORMAL HIGH (ref 1.85–7.84)
NEUTROPHIL %: 77 % — ABNORMAL HIGH (ref 44–74)
NEUTROPHIL %: 74 % (ref 44–74)
PLATELETS: 187 x10ˆ3/uL (ref 140–440)
PLATELETS: 187 x10ˆ3/uL (ref 140–440)
RBC: 4.65 x10ˆ6/uL (ref 4.06–5.63)
RBC: 4.73 x10ˆ6/uL (ref 4.06–5.63)
RDW: 13 % (ref 12.1–16.2)
RDW: 13.2 % (ref 12.1–16.2)
WBC: 11.7 x10ˆ3/uL — ABNORMAL HIGH (ref 3.6–10.2)
WBC: 12.2 x10ˆ3/uL — ABNORMAL HIGH (ref 3.6–10.2)

## 2023-11-26 LAB — ECG 12 LEAD
Atrial Rate: 64 {beats}/min
Calculated P Axis: 33 degrees
Calculated R Axis: 34 degrees
Calculated T Axis: 12 degrees
PR Interval: 184 ms
QRS Duration: 92 ms
QT Interval: 384 ms
QTC Calculation: 396 ms
Ventricular rate: 64 {beats}/min

## 2023-11-26 LAB — PTT (PARTIAL THROMBOPLASTIN TIME): APTT: 24.9 s — ABNORMAL LOW (ref 25.0–38.0)

## 2023-11-26 LAB — PT/INR
INR: 1.07 (ref 0.84–1.10)
PROTHROMBIN TIME: 12.1 s (ref 9.8–12.7)

## 2023-11-26 LAB — LIPASE
LIPASE: 208 U/L — ABNORMAL HIGH (ref 16–77)
LIPASE: 296 U/L — ABNORMAL HIGH (ref 11–82)

## 2023-11-26 MED ORDER — ONDANSETRON 4 MG DISINTEGRATING TABLET
4.0000 mg | ORAL_TABLET | Freq: Three times a day (TID) | ORAL | 0 refills | Status: AC | PRN
Start: 2023-11-26 — End: ?

## 2023-11-26 MED ORDER — SODIUM CHLORIDE 0.9 % IV BOLUS
1000.0000 mL | INJECTION | Status: AC
Start: 2023-11-26 — End: 2023-11-26
  Administered 2023-11-26: 0 mL via INTRAVENOUS
  Administered 2023-11-26: 1000 mL via INTRAVENOUS

## 2023-11-26 MED ORDER — KETOROLAC 10 MG TABLET: 10.0000 mg | ORAL_TABLET | Freq: Four times a day (QID) | ORAL | 0 refills | Status: DC | PRN

## 2023-11-26 NOTE — ED Provider Notes (Signed)
 [facility], [place] - Emergency Department  ED Primary Provider Note  History of Present Illness   Chief Complaint   Patient presents with    Abdominal Pain     Per ems upper abdominal sharp pain started suddenly Thursday 130 pm with nausea and diarrhea,   [place] ems:  Lt ac 20 g , Toradol  30 iv and Zofran 4mg  iv  BS 149     Mark Fischer is a 53 y.o. male who had concerns including Abdominal Pain.  Arrival: The patient arrived by Ambulance complaining of awakening this morning with nausea vomiting epigastric pain.  Patient also had diarrhea this morning.  He stated he had similar nausea vomiting diarrhea that is started on Thursday but then went away.  They went out to dinner Saturday and felt fine.  Sunday felt a little queasy but stated that he did not have nausea or vomiting.  And then this morning getting ready for work he stated all of a sudden he was vomiting continuously.  EMS gave him fluids as well as Zofran IV and Toradol .  He states feeling much better and no nausea presently.  He denies any black or bloody stools.  No dysuria or increased frequency.  No hematuria.    HPI  Review of Systems   Review of Systems   Constitutional:  Positive for activity change and appetite change. Negative for chills and fever.   HENT:  Negative for ear pain and sore throat.    Eyes:  Negative for pain and visual disturbance.   Respiratory:  Negative for cough and shortness of breath.    Cardiovascular:  Negative for chest pain and palpitations.   Gastrointestinal:  Positive for abdominal pain, diarrhea, nausea and vomiting.   Genitourinary:  Negative for dysuria and hematuria.   Musculoskeletal:  Negative for arthralgias and back pain.   Skin:  Negative for color change and rash.   Neurological:  Negative for seizures and syncope.   All other systems reviewed and are negative.     Historical Data   History Reviewed This Encounter:     Physical Exam   ED Triage Vitals [11/26/23 0900]   BP  (Non-Invasive) 129/80   Heart Rate 63   Respiratory Rate 16   Temperature 36.3 C (97.3 F)   SpO2 99 %   Weight 95.3 kg (210 lb)   Height 1.803 m (5' 11)     Physical Exam  Vitals and nursing note reviewed.   Constitutional:       General: He is not in acute distress.     Appearance: Normal appearance. He is well-developed and normal weight.   HENT:      Head: Normocephalic and atraumatic.      Right Ear: External ear normal.      Left Ear: External ear normal.      Nose: Nose normal.      Mouth/Throat:      Mouth: Mucous membranes are dry.   Eyes:      Extraocular Movements: Extraocular movements intact.      Conjunctiva/sclera: Conjunctivae normal.      Pupils: Pupils are equal, round, and reactive to light.   Cardiovascular:      Rate and Rhythm: Normal rate and regular rhythm.      Pulses: Normal pulses.      Heart sounds: Normal heart sounds. No murmur heard.  Pulmonary:      Effort: Pulmonary effort is normal. No respiratory distress.  Breath sounds: Normal breath sounds.   Abdominal:      Palpations: Abdomen is soft.      Tenderness: There is abdominal tenderness.      Comments: Positive tenderness over the epigastric area as well as periumbilical.  There was no guarding or rebound.  Hyperactive bowel sounds throughout the abdomen.   Musculoskeletal:         General: No swelling. Normal range of motion.      Cervical back: Normal range of motion and neck supple.   Skin:     General: Skin is warm and dry.      Capillary Refill: Capillary refill takes less than 2 seconds.   Neurological:      General: No focal deficit present.      Mental Status: He is alert and oriented to person, place, and time.   Psychiatric:         Mood and Affect: Mood normal.         Behavior: Behavior normal.         Thought Content: Thought content normal.       Patient Data     Labs Ordered/Reviewed   COMPREHENSIVE METABOLIC PANEL, NON-FASTING - Abnormal; Notable for the following components:       Result Value    BUN 22 (*)      CREATININE 1.41 (*)     ESTIMATED GFR 59 (*)     ALBUMIN 3.2 (*)     GLUCOSE 125 (*)     All other components within normal limits    Narrative:     Estimated Glomerular Filtration Rate (eGFR) is calculated using the CKD-EPI (2021) equation, intended for patients 32 years of age and older. If gender is not documented or unknown, there will be no eGFR calculation.   LIPASE - Abnormal; Notable for the following components:    LIPASE 208 (*)     All other components within normal limits    Narrative:     Out of range, check for dilution.  Out of range, check for dilution.   PTT (PARTIAL THROMBOPLASTIN TIME) - Abnormal; Notable for the following components:    APTT 24.9 (*)     All other components within normal limits   CBC WITH DIFF - Abnormal; Notable for the following components:    WBC 11.7 (*)     NEUTROPHIL % 77 (*)     LYMPHOCYTE % 13 (*)     NEUTROPHIL # 8.97 (*)     MONOCYTE # 1.01 (*)     All other components within normal limits   PT/INR - Normal    Narrative:     In the setting of warfarin therapy, a moderate-intensity INR goal range is 2.0 to 3.0 and a high-intensity INR goal range is 2.5 to 3.5.    INR is ONLY validated to determine the level of anticoagulation with vitamin K antagonists (warfarin). Other factors may elevate the INR including but not limited to direct oral anticoagulants (DOACs), liver dysfunction, vitamin K deficiency, DIC, factor deficiencies, and factor inhibitors.   URINALYSIS, MACRO/MICRO - Normal   CBC/DIFF    Narrative:     The following orders were created for panel order CBC/DIFF.  Procedure                               Abnormality         Status                     ---------                               -----------         ------  CBC WITH DIFF[[id number]]                Abnormal            Final result                 Please view results for these tests on the individual orders.   URINALYSIS WITH REFLEX MICROSCOPIC AND CULTURE IF POSITIVE    Narrative:      The following orders were created for panel order URINALYSIS WITH REFLEX MICROSCOPIC AND CULTURE IF POSITIVE.  Procedure                               Abnormality         Status                     ---------                               -----------         ------                     URINALYSIS, MACRO/MICRO[[id number]]      Normal              Final result                 Please view results for these tests on the individual orders.     CT ABDOMEN PELVIS WO IV CONTRAST   Final Result by Edi, Radresults In (06/30 1020)   1. ACUTE PANCREATITIS.   2. NEPHROLITHIASIS.   3. COLONIC DIVERTICULOSIS.            Radiologist location ID: [id number]           Medical Decision Making        Medical Decision Making  Patient is 53 year old white male complaining of epigastric pain this morning with nausea vomiting and diarrhea.  He has had multiple bouts of nausea vomiting this morning.  Was given Zofran and Toradol  by EMS in route which he states greatly helped with the nausea and the pain.  Patient states he had abdominal pain with nausea vomiting on Thursday as well but then went away.  He went out to dinner on Saturday and felt great.  Patient will have an IV placed.  Patient will receive fluids as well as labs and CAT scan of his abdomen pelvis.  Patient will be treated for results and then possibly discharged home.  Patient will follow up with PMD in the next 2-3 days.    Amount and/or Complexity of Data Reviewed  Labs: ordered.  Radiology: ordered.  ECG/medicine tests: ordered.     Details: Normal sinus rhythm 64, PR interval 184 MS, occasional PACs, QT interval 384 MS, otherwise normal EKG             Medications Ordered/Administered in the ED   NS bolus infusion 1,000 mL (0 mL Intravenous Stopped 11/26/23 1033)     Clinical Impression   Pancreatitis (Primary)   Diverticulosis   Nausea & vomiting   Dehydration       Disposition: Discharged               Clinical Impression   Pancreatitis (Primary)   Diverticulosis    Nausea & vomiting   Dehydration  Current Discharge Medication List        START taking these medications    Details   ketorolac  tromethamine  (TORADOL ) 10 mg Oral Tablet Take 1 Tablet (10 mg total) by mouth Every 6 hours as needed for Pain  Qty: 20 Tablet, Refills: 0      ondansetron (ZOFRAN ODT) 4 mg Oral Tablet, Rapid Dissolve Take 1 Tablet (4 mg total) by mouth Every 8 hours as needed for Nausea/Vomiting  Qty: 12 Tablet, Refills: 0

## 2023-11-26 NOTE — ED Nurses Note (Signed)
 Patient discharged home. Reviewed instructions and prescriptions with patient. Questions sufficiently answered as needed. Patient verbalized understanding of instructions and prescriptions. Patient encouraged to follow up with PCP as indicated. In the event of an emergency, patient instructed to call 911 or go to the nearest emergency room. Patient ambulated off the unit.

## 2023-11-26 NOTE — ED Triage Notes (Signed)
 Pt into ED for abd pain, centrally located and going into back. Was in [facility] ED and diagnosed w/ pancreatitis. Was told if pain became worse to come to ED.

## 2023-11-27 ENCOUNTER — Encounter (HOSPITAL_COMMUNITY): Payer: Self-pay

## 2023-11-27 DIAGNOSIS — R197 Diarrhea, unspecified: Secondary | ICD-10-CM

## 2023-11-27 DIAGNOSIS — N289 Disorder of kidney and ureter, unspecified: Secondary | ICD-10-CM

## 2023-11-27 DIAGNOSIS — K859 Acute pancreatitis without necrosis or infection, unspecified: Principal | ICD-10-CM | POA: Diagnosis present

## 2023-11-27 DIAGNOSIS — D72829 Elevated white blood cell count, unspecified: Secondary | ICD-10-CM

## 2023-11-27 DIAGNOSIS — I10 Essential (primary) hypertension: Secondary | ICD-10-CM

## 2023-11-27 DIAGNOSIS — R748 Abnormal levels of other serum enzymes: Secondary | ICD-10-CM

## 2023-11-27 DIAGNOSIS — Z9049 Acquired absence of other specified parts of digestive tract: Secondary | ICD-10-CM

## 2023-11-27 DIAGNOSIS — E119 Type 2 diabetes mellitus without complications: Secondary | ICD-10-CM

## 2023-11-27 LAB — GRAY TOP TUBE

## 2023-11-27 LAB — LIPID PANEL
CHOL/HDL RATIO: 3.5
CHOLESTEROL: 113 mg/dL (ref <200)
HDL CHOL: 32 mg/dL — ABNORMAL LOW (ref >=40)
LDL CALC: 65 mg/dL (ref 0–100)
TRIGLYCERIDES: 81 mg/dL (ref <=150)
VLDL CALC: 16 mg/dL (ref 0–50)

## 2023-11-27 LAB — GOLD TOP TUBE

## 2023-11-27 LAB — POC BLOOD GLUCOSE (RESULTS)
GLUCOSE, POC: 119 mg/dL — ABNORMAL HIGH (ref 70–100)
GLUCOSE, POC: 126 mg/dL — ABNORMAL HIGH (ref 70–100)
GLUCOSE, POC: 101 mg/dL — ABNORMAL HIGH (ref 70–100)
GLUCOSE, POC: 86 mg/dL (ref 70–100)

## 2023-11-27 LAB — BLUE TOP TUBE

## 2023-11-27 MED ORDER — GLUCAGON HCL 1 MG/ML SOLUTION FOR INJECTION
1.0000 mg | Freq: Once | INTRAMUSCULAR | Status: DC | PRN
Start: 2023-11-27 — End: 2023-11-28

## 2023-11-27 MED ORDER — FENTANYL (PF) 50 MCG/ML INJECTION SOLUTION
50.0000 ug | INTRAMUSCULAR | Status: AC
Start: 2023-11-27 — End: 2023-11-27
  Administered 2023-11-27: 50 ug via INTRAVENOUS

## 2023-11-27 MED ORDER — SODIUM CHLORIDE 0.9 % INTRAVENOUS SOLUTION
INTRAVENOUS | Status: DC
Start: 2023-11-27 — End: 2023-11-27
  Administered 2023-11-27: 0 mL via INTRAVENOUS

## 2023-11-27 MED ORDER — ENOXAPARIN 40 MG/0.4 ML SUBCUTANEOUS SYRINGE
INJECTION | SUBCUTANEOUS | Status: AC
Start: 2023-11-27 — End: 2023-11-27
  Filled 2023-11-27: qty 0.4

## 2023-11-27 MED ORDER — DEXTROSE 50 % IN WATER (D50W) INTRAVENOUS SYRINGE
12.5000 g | INJECTION | INTRAVENOUS | Status: DC | PRN
Start: 2023-11-27 — End: 2023-11-28

## 2023-11-27 MED ORDER — DEXTROSE 40 % ORAL GEL
15.0000 g | ORAL | Status: DC | PRN
Start: 2023-11-27 — End: 2023-11-28

## 2023-11-27 MED ORDER — SODIUM CHLORIDE 0.9 % (FLUSH) INJECTION SYRINGE
3.0000 mL | INJECTION | INTRAMUSCULAR | Status: DC | PRN
Start: 2023-11-27 — End: 2023-11-28

## 2023-11-27 MED ORDER — ONDANSETRON HCL (PF) 4 MG/2 ML INJECTION SOLUTION
INTRAMUSCULAR | Status: AC
Start: 2023-11-27 — End: 2023-11-27
  Filled 2023-11-27: qty 2

## 2023-11-27 MED ORDER — ENOXAPARIN 40 MG/0.4 ML SUBCUTANEOUS SYRINGE
40.0000 mg | INJECTION | SUBCUTANEOUS | Status: DC
Start: 2023-11-27 — End: 2023-11-28
  Administered 2023-11-27 – 2023-11-28 (×2): 40 mg via SUBCUTANEOUS
  Filled 2023-11-27: qty 0.4

## 2023-11-27 MED ORDER — MORPHINE 2 MG/ML INJECTION WRAPPER
2.0000 mg | INJECTION | INTRAMUSCULAR | Status: DC | PRN
Start: 2023-11-27 — End: 2023-11-28
  Administered 2023-11-27 (×4): 2 mg via INTRAVENOUS
  Filled 2023-11-27 (×4): qty 1

## 2023-11-27 MED ORDER — PROCHLORPERAZINE EDISYLATE 10 MG/2 ML (5 MG/ML) INJECTION SOLUTION
10.0000 mg | Freq: Four times a day (QID) | INTRAMUSCULAR | Status: DC | PRN
Start: 2023-11-27 — End: 2023-11-28

## 2023-11-27 MED ORDER — LACTATED RINGERS INTRAVENOUS SOLUTION
INTRAVENOUS | Status: DC
Start: 2023-11-27 — End: 2023-11-28

## 2023-11-27 MED ORDER — ONDANSETRON HCL (PF) 4 MG/2 ML INJECTION SOLUTION
4.0000 mg | INTRAMUSCULAR | Status: AC
Start: 2023-11-27 — End: 2023-11-27
  Administered 2023-11-27: 4 mg via INTRAVENOUS

## 2023-11-27 MED ORDER — INSULIN LISPRO 100 UNIT/ML SUB-Q SSIP VIAL
2.0000 [IU] | INJECTION | Freq: Four times a day (QID) | SUBCUTANEOUS | Status: DC
Start: 2023-11-27 — End: 2023-11-28
  Administered 2023-11-27 – 2023-11-28 (×7): 0 [IU] via SUBCUTANEOUS

## 2023-11-27 MED ORDER — FENTANYL (PF) 50 MCG/ML INJECTION SOLUTION
INTRAMUSCULAR | Status: AC
Start: 2023-11-27 — End: 2023-11-27
  Filled 2023-11-27: qty 2

## 2023-11-27 MED ORDER — SODIUM CHLORIDE 0.9 % IV BOLUS
1000.0000 mL | INJECTION | Status: AC
Start: 2023-11-27 — End: 2023-11-27
  Administered 2023-11-27: 1000 mL via INTRAVENOUS
  Administered 2023-11-27: 0 mL via INTRAVENOUS

## 2023-11-27 MED ORDER — ONDANSETRON HCL (PF) 4 MG/2 ML INJECTION SOLUTION
4.0000 mg | Freq: Four times a day (QID) | INTRAMUSCULAR | Status: DC | PRN
Start: 2023-11-27 — End: 2023-11-28

## 2023-11-27 MED ORDER — SODIUM CHLORIDE 0.9 % (FLUSH) INJECTION SYRINGE
3.0000 mL | INJECTION | Freq: Three times a day (TID) | INTRAMUSCULAR | Status: DC
Start: 2023-11-27 — End: 2023-11-28
  Administered 2023-11-27: 3 mL
  Administered 2023-11-27 – 2023-11-28 (×3): 0 mL
  Administered 2023-11-28: 3 mL

## 2023-11-27 MED ORDER — PANTOPRAZOLE 40 MG TABLET,DELAYED RELEASE
40.0000 mg | DELAYED_RELEASE_TABLET | Freq: Every day | ORAL | Status: DC
Start: 2023-11-27 — End: 2023-11-28
  Administered 2023-11-27 – 2023-11-28 (×2): 40 mg via ORAL
  Filled 2023-11-27 (×2): qty 1

## 2023-11-27 MED ORDER — CYCLOBENZAPRINE 10 MG TABLET
10.0000 mg | ORAL_TABLET | Freq: Three times a day (TID) | ORAL | Status: DC | PRN
Start: 2023-11-27 — End: 2023-11-28

## 2023-11-27 MED ORDER — LOSARTAN 50 MG TABLET
100.0000 mg | ORAL_TABLET | Freq: Every day | ORAL | Status: DC
Start: 2023-11-27 — End: 2023-11-28
  Administered 2023-11-27 – 2023-11-28 (×2): 0 mg via ORAL
  Filled 2023-11-27 (×2): qty 2

## 2023-11-27 NOTE — Care Management Notes (Signed)
 [facility]  Care Management Initial Evaluation    Patient Name: Mark Fischer  Date of Birth: 06-18-1970  Sex: male  Date/Time of Admission: 11/26/2023  8:54 PM  Room/Bed: 366/A  Payor: BLUE CROSS BLUE SHIELD / Plan: ANTHEM BCBS PPO / Product Type: PPO /   Primary Care Providers:  Pcp, No (General)    Pharmacy Info:   Preferred Pharmacy       [phone] [place], [place] - [address]    [address] [place] [address]    Phone: [phone] Fax: [phone]    Hours: Not open 24 hours          Emergency Contact Info:   Extended Emergency Contact Information  Primary Emergency Contact: Dequincy Born  Mobile Phone: [phone]  Relation: Wife  Preferred language: English  Interpreter needed? No    History:   BIFF RUTIGLIANO is a 53 y.o., male, admitted 11/26/23.    Height/Weight: 180.3 cm (5' 11) / 97.7 kg (215 lb 6.4 oz)     LOS: 0 days   Admitting Diagnosis: Pancreatitis [K85.90]    Assessment:      11/27/23 1355   Assessment Details   Assessment Type Admission   Date of Care Management Update 11/27/23   Readmission   Is this a readmission? No   Insurance Information/Type   Insurance type Commercial   Employment/Financial   Patient has Prescription Coverage?  Yes        Name of Insurance Coverage for Medications BCBS   Financial/Environmental Concerns none   Living Environment   Select an age group to open lives with row.  Adult   Lives With spouse   Living Arrangements house   Able to Return to Prior Arrangements yes   IEP and/or 504 Plan? No   Home Safety   Home Assessment: Stairs in Home   Home Accessibility stairs to enter home;stairs within home   Custody and Legal Status   Do you have a court appointed guardian/conservator? No   Are you an emancipated minor? No   Custody Issues? No   Paternity Affidavit Requested? No   Care Management Plan   Discharge Planning Status initial meeting   Projected Discharge Date 11/27/23   Discharge plan  discussed with: Patient   CM will evaluate for rehabilitation potential no   Discharge Needs Assessment   Equipment Currently Used at Home none   Equipment Needed After Discharge none   Discharge Facility/Level of Care Needs Home (Patient/Family Member/other)(code 1)   Transportation Available car;family or friend will provide   Discharge Information   Discharge Disposition home or self-care   Referral Information   Admission Type inpatient   Arrived From home or self-care   Home Main Entrance   Number of Stairs, Main Entrance three   Stairs Within Home, Primary   Number of Stairs, Within Home, Primary other (see comments)  (10-12 steps to second floor)     Initial CM assessment completed. Pt admitted on 11/26/23 with pancreatitis. CM met with pt at bedside. Pt was alert and oriented to all spheres. Pt lives at home with his wife and plans to return there upon discharge. There are 3 steps to get into his home and there is 10-12 steps to get to his second floor. Pt does not use any durable medical equipment. He does not have any in home services. Pt is still driving. Pt is still working. [facility] [place] and denies any problems obtaining medications. Pt will return home upon discharge.    Discharge Plan:  Home (Patient/Family Member/other) (code 1)      The patient will continue to be evaluated for developing discharge needs.     Case Manager: Knox Ahle, [place]  Phone: [phone]

## 2023-11-27 NOTE — H&P (Addendum)
 [facility]  Admission H&P      Date of Service:  11/27/2023  [name] y.o. male  Date of Admission:  11/26/2023  Date of Birth:  1970-08-22      Chief Complaint:  Abdominal pain    HPI: Mark Fischer is a 53 y.o., White male who presents with  abdominal pain for the past 4 days says he cut down on his oral intake and lay in bed for a couple of days and got better, then went out and had a meal and start having similar symptoms again     nausea vomiting diarrhea and epigastric pain. Patient has previous cholecystectomy. Went to [facility] earlier today had elevated lipase and CT scan that demonstrated pancreatitis attempted to go home for outpatient therapy but has failed.     Never had similar episodes in the past patient does not drink alcohol and is not aware of any hypertriglyceridemia  No other issues currently    He is being admitted for treatment of pancreatitis          History:    Past Medical:    Hypertension  Diabetes mellitus    Past Surgical:  Cholecystectomy  Family:    Family Medical History:    None       Social:   reports that he has never smoked. He has never used smokeless tobacco. He reports that he does not drink alcohol and does not use drugs.    Allergies[1]  Medications Prior to Admission       Prescriptions    cyclobenzaprine  (FLEXERIL ) 10 mg Oral Tablet    Take 1 Tablet (10 mg total) by mouth Three times a day as needed for Muscle spasms    hydroCHLOROthiazide (HYDRODIURIL) 25 mg Oral Tablet    Take 1 Tablet (25 mg total) by mouth    ketorolac  tromethamine  (TORADOL ) 10 mg Oral Tablet    Take 1 Tablet (10 mg total) by mouth Every 6 hours as needed for Pain    losartan (COZAAR) 100 mg Oral Tablet    Take 1 Tablet (100 mg total) by mouth    MetFORMIN (GLUCOPHAGE) 1,000 mg Oral Tablet    Take 1 Tablet (1,000 mg total) by mouth Twice daily with food    omeprazole (PRILOSEC) 20 mg Oral Capsule, Delayed Release(E.C.)    Take 1 Capsule (20 mg total) by mouth Daily     ondansetron (ZOFRAN ODT) 4 mg Oral Tablet, Rapid Dissolve    Take 1 Tablet (4 mg total) by mouth Every 8 hours as needed for Nausea/Vomiting    sitaGLIPtin phosphate (JANUVIA) 100 mg Oral Tablet    Take 1 Tablet (100 mg total) by mouth          Correction/SSIP insulin lispro 100 units/mL injection, 2-9 Units, Subcutaneous, 4x/day AC  cyclobenzaprine  (FLEXERIL ) tablet, 10 mg, Oral, 3x/day PRN  dextrose (GLUTOSE) 40% oral gel, 15 g, Oral, Q15 Min PRN  dextrose 50% (0.5 g/mL) injection - syringe, 12.5 g, Intravenous, Q15 Min PRN  enoxaparin PF (LOVENOX) 40 mg/0.4 mL SubQ injection, 40 mg, Subcutaneous, Q24H  fentaNYL (SUBLIMAZE) 50 mcg/mL injection, 50 mcg, Intravenous, Now  glucagon injection 1 mg, 1 mg, IntraMUSCULAR, Once PRN  losartan (COZAAR) tablet, 100 mg, Oral, Daily  NS bolus infusion 1,000 mL, 1,000 mL, Intravenous, Now  NS flush syringe, 3 mL, Intracatheter, Q8HRS  NS flush syringe, 3 mL, Intracatheter, Q1H PRN  NS premix infusion, , Intravenous, Continuous  ondansetron (ZOFRAN) 2 mg/mL  injection, 4 mg, Intravenous, Now  ondansetron (ZOFRAN) 2 mg/mL injection, 4 mg, Intravenous, Q6H PRN  pantoprazole (PROTONIX) delayed release tablet, 40 mg, Oral, Daily  prochlorperazine (COMPAZINE) 5 mg/mL injection, 10 mg, Intravenous, Q6H PRN        ROS:   Review of Systems    All other systems negative unless mentioned in HPI       Exam:  Vitals:    11/26/23 2054   BP: (!) 148/67   Pulse: (!) 107   Resp: 17   Temp: 36.7 C (98 F)   SpO2: 98%   Weight: 95.3 kg (210 lb)   Height: 1.803 m (5' 11)   BMI: 29.29           Physical Exam    -GENERAL: No acute distress, Resting comfortably   -SKIN: warm and dry.  -CARDIAC: RRR, no MRGs  -LUNGS: Clear to auscultation bilaterally. No wheezes or rhonchi.  -GI: Abdomen soft, epigastric tenderness  -EXTREMITIES: No pedal edema noted. Distal pulses equal bilaterally.  -NEUROLOGICAL: Cranial nerves grossly intact. No gross focal deficit.         Labs:     Results for orders placed  or performed during the hospital encounter of 11/26/23 (from the past 24 hours)   CBC/DIFF    Collection Time: 11/26/23  9:13 PM    Narrative    The following orders were created for panel order CBC/DIFF.  Procedure                               Abnormality         Status                     ---------                               -----------         ------                     CBC WITH IPQQ[[id number]]                Abnormal            Final result                 Please view results for these tests on the individual orders.   COMPREHENSIVE METABOLIC PANEL, NON-FASTING    Collection Time: 11/26/23  9:13 PM   Result Value Ref Range    SODIUM 133 (L) 136 - 145 mmol/L    POTASSIUM 3.8 3.5 - 5.1 mmol/L    CHLORIDE 99 98 - 107 mmol/L    CO2 TOTAL 25 21 - 31 mmol/L    ANION GAP 9 4 - 13 mmol/L    BUN 20 7 - 25 mg/dL    CREATININE 8.66 (H) 0.60 - 1.30 mg/dL    BUN/CREA RATIO 15 6 - 22    ESTIMATED GFR 64 >59 mL/min/1.72m^2    ALBUMIN 3.9 3.5 - 5.7 g/dL    CALCIUM 8.8 8.6 - 89.6 mg/dL    GLUCOSE 775 (H) 74 - 109 mg/dL    ALKALINE PHOSPHATASE 72 34 - 104 U/L    ALT (SGPT) 18 7 - 52 U/L    AST (SGOT) 19 13 - 39 U/L    BILIRUBIN TOTAL 1.0 0.3 - 1.0 mg/dL  PROTEIN TOTAL 7.2 6.4 - 8.9 g/dL    ALBUMIN/GLOBULIN RATIO 1.2 0.8 - 1.4    OSMOLALITY, CALCULATED 276 270 - 290 mOsm/kg    CALCIUM, CORRECTED 8.9 8.9 - 10.8 mg/dL    GLOBULIN 3.3 2.0 - 3.5    Narrative    Estimated Glomerular Filtration Rate (eGFR) is calculated using the CKD-EPI (2021) equation, intended for patients 36 years of age and older. If gender is not documented or unknown, there will be no eGFR calculation.     LIPASE    Collection Time: 11/26/23  9:13 PM   Result Value Ref Range    LIPASE 296 (H) 11 - 82 U/L   CBC WITH DIFF    Collection Time: 11/26/23  9:13 PM   Result Value Ref Range    WBC 12.2 (H) 3.6 - 10.2 x10^3/uL    RBC 4.73 4.06 - 5.63 x10^6/uL    HGB 14.1 12.5 - 16.3 g/dL    HCT 59.5 63.2 - 52.8 %    MCV 85.4 73.0 - 96.2 fL    MCH 29.7 23.8 - 33.4  pg    MCHC 34.8 32.5 - 36.3 g/dL    RDW 86.7 87.8 - 83.7 %    PLATELETS 187 140 - 440 x10^3/uL    MPV 9.5 7.4 - 11.4 fL    NEUTROPHIL % 74 44 - 74 %    LYMPHOCYTE % 14 (L) 15 - 43 %    MONOCYTE % 11 6 - 14 %    EOSINOPHIL % 1 1 - 8 %    BASOPHIL % 0 0 - 1 %    NEUTROPHIL # 9.10 (H) 1.85 - 7.84 x10^3/uL    LYMPHOCYTE # 1.70 1.00 - 3.00 x10^3/uL    MONOCYTE # 1.40 (H) 0.30 - 1.10 x10^3/uL    EOSINOPHIL # 0.10 0.00 - 0.60 x10^3/uL    BASOPHIL # 0.00 0.00 - 0.10 x10^3/uL   EXTRA TUBES    Collection Time: 11/26/23  9:21 PM    Narrative    The following orders were created for panel order EXTRA TUBES.  Procedure                               Abnormality         Status                     ---------                               -----------         ------                     BLUE TOP ULAZ[[id number]]                                    Final result               GOLD TOP ULAZ[[id number]]                                    In process                 GRAY TOP ULAZ[[id number]]  Final result                 Please view results for these tests on the individual orders.   BLUE TOP TUBE    Collection Time: 11/26/23  9:21 PM   Result Value Ref Range    RAINBOW/EXTRA TUBE AUTO RESULT Yes    GRAY TOP TUBE    Collection Time: 11/26/23  9:21 PM   Result Value Ref Range    RAINBOW/EXTRA TUBE AUTO RESULT Yes    URINALYSIS, MACROSCOPIC AND MICROSCOPIC W/CULTURE REFLEX    Collection Time: 11/26/23 10:38 PM    Specimen: Urine, Site not specified    Narrative    The following orders were created for panel order URINALYSIS, MACROSCOPIC AND MICROSCOPIC W/CULTURE REFLEX.  Procedure                               Abnormality         Status                     ---------                               -----------         ------                     URINALYSIS, MACROSCOPIC[[id number]]      Abnormal            Final result               URINALYSIS, MICROSCOPIC[[id number]]      Abnormal            Final result                  Please view results for these tests on the individual orders.   URINALYSIS, MACROSCOPIC    Collection Time: 11/26/23 10:38 PM   Result Value Ref Range    COLOR Yellow Colorless, Light Yellow, Yellow    APPEARANCE Clear Clear    SPECIFIC GRAVITY 1.034 (H) 1.002 - 1.030    PH 5.5 5.0 - 9.0    LEUKOCYTES Negative Negative, 100  WBCs/uL    NITRITE Negative Negative    PROTEIN 20 Negative, 10 , 20  mg/dL    GLUCOSE 70 (A) Negative, 30  mg/dL    KETONES Negative Negative, Trace mg/dL    BILIRUBIN Negative Negative, 0.5 mg/dL    BLOOD Negative Negative, 0.03 mg/dL    UROBILINOGEN 2 (A) Normal mg/dL   URINALYSIS, MICROSCOPIC    Collection Time: 11/26/23 10:38 PM   Result Value Ref Range    MUCOUS Moderate (A) Rare, Occasional, Few /hpf    RBCS 3 <4 /hpf    WBCS 3 <6 /hpf    SQUAMOUS EPITHELIAL <1 <28 /hpf   Results for orders placed or performed during the hospital encounter of 11/26/23 (from the past 24 hours)   CBC/DIFF    Collection Time: 11/26/23  9:38 AM    Narrative    The following orders were created for panel order CBC/DIFF.  Procedure                               Abnormality         Status                     ---------                               -----------         ------  CBC WITH DIFF[[id number]]                Abnormal            Final result                 Please view results for these tests on the individual orders.   COMPREHENSIVE METABOLIC PANEL, NON-FASTING    Collection Time: 11/26/23  9:38 AM   Result Value Ref Range    SODIUM 138 136 - 145 mmol/L    POTASSIUM 4.4 3.5 - 5.1 mmol/L    CHLORIDE 101 98 - 107 mmol/L    CO2 TOTAL 29 21 - 32 mmol/L    ANION GAP 8 4 - 13 mmol/L    BUN 22 (H) 7 - 18 mg/dL    CREATININE 8.58 (H) 0.70 - 1.30 mg/dL    BUN/CREA RATIO 16     ESTIMATED GFR 59 (L) >59 mL/min/1.50m^2    ALBUMIN 3.2 (L) 3.4 - 5.0 g/dL    CALCIUM 8.8 8.5 - 89.8 mg/dL    GLUCOSE 874 (H) 74 - 106 mg/dL    ALKALINE PHOSPHATASE 96 46 - 116 U/L    ALT (SGPT) 27 <=78 U/L    AST (SGOT)  20 15 - 37 U/L    BILIRUBIN TOTAL 0.7 0.2 - 1.0 mg/dL    PROTEIN TOTAL 7.1 6.4 - 8.2 g/dL    ALBUMIN/GLOBULIN RATIO 0.8 0.8 - 1.4    OSMOLALITY, CALCULATED 281 270 - 290 mOsm/kg    CALCIUM, CORRECTED 9.4 mg/dL    GLOBULIN 3.9     Narrative    Estimated Glomerular Filtration Rate (eGFR) is calculated using the CKD-EPI (2021) equation, intended for patients 41 years of age and older. If gender is not documented or unknown, there will be no eGFR calculation.   LIPASE    Collection Time: 11/26/23  9:38 AM   Result Value Ref Range    LIPASE 208 (H) 16 - 77 U/L    Narrative    Out of range, check for dilution.  Out of range, check for dilution.   PT/INR    Collection Time: 11/26/23  9:38 AM   Result Value Ref Range    PROTHROMBIN TIME 12.1 9.8 - 12.7 seconds    INR 1.07 0.84 - 1.10    Narrative    In the setting of warfarin therapy, a moderate-intensity INR goal range is 2.0 to 3.0 and a high-intensity INR goal range is 2.5 to 3.5.    INR is ONLY validated to determine the level of anticoagulation with vitamin K antagonists (warfarin). Other factors may elevate the INR including but not limited to direct oral anticoagulants (DOACs), liver dysfunction, vitamin K deficiency, DIC, factor deficiencies, and factor inhibitors.   PTT (PARTIAL THROMBOPLASTIN TIME)    Collection Time: 11/26/23  9:38 AM   Result Value Ref Range    APTT 24.9 (L) 25.0 - 38.0 seconds   CBC WITH DIFF    Collection Time: 11/26/23  9:38 AM   Result Value Ref Range    WBC 11.7 (H) 3.6 - 10.2 x10^3/uL    RBC 4.65 4.06 - 5.63 x10^6/uL    HGB 14.1 12.5 - 16.3 g/dL    HCT 58.1 63.2 - 52.8 %    MCV 89.9 73.0 - 96.2 fL    MCH 30.2 23.8 - 33.4 pg    MCHC 33.6 32.5 - 36.3 g/dL    RDW 86.9 87.8 - 83.7 %  PLATELETS 187 140 - 440 x10^3/uL    MPV 9.4 7.4 - 11.4 fL    NEUTROPHIL % 77 (H) 44 - 74 %    LYMPHOCYTE % 13 (L) 15 - 43 %    MONOCYTE % 9 6 - 14 %    EOSINOPHIL % 1 1 - 8 %    BASOPHIL % 0 0 - 1 %    NEUTROPHIL # 8.97 (H) 1.85 - 7.84 x10^3/uL    LYMPHOCYTE #  1.53 1.00 - 3.00 x10^3/uL    MONOCYTE # 1.01 (H) 0.30 - 1.00 x10^3/uL    EOSINOPHIL # 0.12 0.00 - 0.60 x10^3/uL    BASOPHIL # 0.04 0.00 - 0.10 x10^3/uL   URINALYSIS WITH REFLEX MICROSCOPIC AND CULTURE IF POSITIVE    Collection Time: 11/26/23 10:09 AM    Specimen: Urine, Site not specified    Narrative    The following orders were created for panel order URINALYSIS WITH REFLEX MICROSCOPIC AND CULTURE IF POSITIVE.  Procedure                               Abnormality         Status                     ---------                               -----------         ------                     URINALYSIS, MACRO/MICRO[[id number]]      Normal              Final result                 Please view results for these tests on the individual orders.   URINALYSIS, MACRO/MICRO    Collection Time: 11/26/23 10:09 AM   Result Value Ref Range    COLOR Light Yellow Light Yellow, Yellow    APPEARANCE Clear Clear    SPECIFIC GRAVITY 1.020 1.003 - 1.035    PH 6.0 4.6 - 8.0    LEUKOCYTES Negative Negative WBCs/uL    NITRITE Negative Negative    PROTEIN Negative Negative mg/dL    GLUCOSE Negative Negative mg/dL    KETONES Negative Negative mg/dL    BILIRUBIN Negative Negative mg/dL    BLOOD Negative Negative mg/dL    UROBILINOGEN 0.2 0.2 - 1.0 mg/dL        Imaging Studies:    No orders to display       DNR Status:  No Order    Assessment/Plan:   Active Hospital Problems    Diagnosis    Primary Problem: Pancreatitis     1. Acute pancreatitis, clear liquid diet, symptomatic treatment for nausea and abdominal pain, IV fluids, lipase in a.m.  Check lipid panel, patient with history of cholecystectomy, discontinue HCTZ which could be a cause of pancreatitis    2. Hypertension continue losartan, discontinue HCTZ, monitor blood pressure and add/adjust medications as needed      3. Diabetes mellitus hold scheduled medications and put him on sliding scale    4. Mild leukocytosis likely stress related, CBC in a.m.    5. Renal insufficiency , hydrate  repeat labs in a.m., possibly baseline?  DVT/PE Prophylaxis:  Lovenox      Dannis Locks, MD    This note was partially generated using MModal Fluency Direct system, and there may be some incorrect words, spellings, and punctuation that were not noted in checking the note before saving.         [1] No Known Allergies

## 2023-11-27 NOTE — Care Plan (Signed)
 Problem: Adult Inpatient Plan of Care  Goal: Plan of Care Review  Outcome: Ongoing (see interventions/notes)  Goal: Patient-Specific Goal (Individualized)  Outcome: Ongoing (see interventions/notes)  Flowsheets (Taken 11/27/2023 0336)  Individualized Care Needs: assist with ADL's, monitor vital signs, prn medications given as neede  Anxieties, Fears or Concerns: pain  Goal: Absence of Hospital-Acquired Illness or Injury  Outcome: Ongoing (see interventions/notes)  Intervention: Identify and Manage Fall Risk  Recent Flowsheet Documentation  Taken 11/27/2023 0344 by Rosina NOVAK, RN  Safety Promotion/Fall Prevention:   activity supervised   nonskid shoes/slippers when out of bed   safety round/check completed  Intervention: Prevent Skin Injury  Recent Flowsheet Documentation  Taken 11/27/2023 0344 by Rosina NOVAK, RN  Body Position: supine, head elevated  Goal: Optimal Comfort and Wellbeing  Outcome: Ongoing (see interventions/notes)  Intervention: Provide Person-Centered Care  Recent Flowsheet Documentation  Taken 11/27/2023 0344 by Rosina NOVAK, RN  Trust Relationship/Rapport:   care explained   choices provided   emotional support provided  Goal: Rounds/Family Conference  Outcome: Ongoing (see interventions/notes)     Problem: Pain Acute  Goal: Optimal Pain Control and Function  Outcome: Ongoing (see interventions/notes)  Intervention: Prevent or Manage Pain  Recent Flowsheet Documentation  Taken 11/27/2023 0344 by Rosina NOVAK, RN  Medication Review/Management: medications reviewed     Problem: Pancreatitis  Goal: Fluid and Electrolyte Balance  Outcome: Ongoing (see interventions/notes)  Goal: Absence of Infection Signs and Symptoms  Outcome: Ongoing (see interventions/notes)  Intervention: Prevent or Manage Infection  Recent Flowsheet Documentation  Taken 11/27/2023 0344 by Rosina NOVAK, RN  Fever Reduction/Comfort Measures:   lightweight bedding   lightweight clothing  Goal: Optimal Nutrition Delivery  Outcome: Ongoing (see  interventions/notes)  Goal: Optimal Pain Control and Function  Outcome: Ongoing (see interventions/notes)  Goal: Effective Oxygenation and Ventilation  Outcome: Ongoing (see interventions/notes)  Intervention: Optimize Oxygenation and Ventilation  Recent Flowsheet Documentation  Taken 11/27/2023 0344 by Rosina NOVAK, RN  Activity Management: ambulated in room  Head of Bed [facility]) Positioning: HOB at 60 degrees

## 2023-11-27 NOTE — ED Nurses Note (Signed)
 Report called to [place] on [facility]S at this time.

## 2023-11-27 NOTE — ED Provider Notes (Signed)
 [facility] - Emergency Department  ED Primary Note  History of Present Illness   Mark Fischer is a 53 y.o. male who had concerns including Abdominal Pain.     Patient is a 53 year old male past will history of previous cholecystectomy presents emergency department with complaints of abdominal pain.  He states he has had symptoms ongoing for the past couple of days.  He states he went to [facility] ER earlier today and was diagnosed with a pancreatitis.  He was sent home to try outpatient therapy.  He states the pain has gotten worse.  States he has been having vomiting.  Denies alcohol use.    Physical Exam   ED Triage Vitals [11/26/23 2054]   BP (Non-Invasive) (!) 148/67   Heart Rate (!) 107   Respiratory Rate 17   Temperature 36.7 C (98 F)   SpO2 98 %   Weight 95.3 kg (210 lb)   Height 1.803 m (5' 11)     Physical Exam  Constitutional:       Appearance: Normal appearance.   HENT:      Head: Normocephalic.      Nose: Nose normal.      Mouth/Throat:      Mouth: Mucous membranes are moist.   Eyes:      Extraocular Movements: Extraocular movements intact.      Pupils: Pupils are equal, round, and reactive to light.   Cardiovascular:      Rate and Rhythm: Normal rate and regular rhythm.      Pulses: Normal pulses.      Heart sounds: Normal heart sounds.   Pulmonary:      Effort: Pulmonary effort is normal.      Breath sounds: Normal breath sounds.   Abdominal:      General: Abdomen is flat.      Palpations: Abdomen is soft.      Comments: Epigastric area tender to palpation   Musculoskeletal:      Cervical back: Normal range of motion.   Neurological:      Mental Status: He is alert.       Patient Data   Labs Ordered/Reviewed   COMPREHENSIVE METABOLIC PANEL, NON-FASTING - Abnormal; Notable for the following components:       Result Value    SODIUM 133 (*)     CREATININE 1.33 (*)     GLUCOSE 224 (*)     All other components within normal limits    Narrative:     Estimated Glomerular  Filtration Rate (eGFR) is calculated using the CKD-EPI (2021) equation, intended for patients 59 years of age and older. If gender is not documented or unknown, there will be no eGFR calculation.     LIPASE - Abnormal; Notable for the following components:    LIPASE 296 (*)     All other components within normal limits   CBC WITH DIFF - Abnormal; Notable for the following components:    WBC 12.2 (*)     LYMPHOCYTE % 14 (*)     NEUTROPHIL # 9.10 (*)     MONOCYTE # 1.40 (*)     All other components within normal limits   URINALYSIS, MACROSCOPIC - Abnormal; Notable for the following components:    SPECIFIC GRAVITY 1.034 (*)     GLUCOSE 70 (*)     UROBILINOGEN 2 (*)     All other components within normal limits   URINALYSIS, MICROSCOPIC - Abnormal; Notable for the following components:  MUCOUS Moderate (*)     All other components within normal limits   CBC/DIFF    Narrative:     The following orders were created for panel order CBC/DIFF.  Procedure                               Abnormality         Status                     ---------                               -----------         ------                     CBC WITH IPQQ[[id number]]                Abnormal            Final result                 Please view results for these tests on the individual orders.   URINALYSIS, MACROSCOPIC AND MICROSCOPIC W/CULTURE REFLEX    Narrative:     The following orders were created for panel order URINALYSIS, MACROSCOPIC AND MICROSCOPIC W/CULTURE REFLEX.  Procedure                               Abnormality         Status                     ---------                               -----------         ------                     URINALYSIS, MACROSCOPIC[[id number]]      Abnormal            Final result               URINALYSIS, MICROSCOPIC[[id number]]      Abnormal            Final result                 Please view results for these tests on the individual orders.   BLUE TOP TUBE   GRAY TOP TUBE   EXTRA TUBES    Narrative:     The  following orders were created for panel order EXTRA TUBES.  Procedure                               Abnormality         Status                     ---------                               -----------         ------  BLUE TOP ULAZ[[id number]]                                    Final result               GOLD TOP ULAZ[[id number]]                                    In process                 GRAY TOP ULAZ[[id number]]                                    Final result                 Please view results for these tests on the individual orders.   GOLD TOP TUBE     No orders to display     Medical Decision Making        Medical Decision Making  Patient's CBC demonstrates some leukocytosis white blood cell count 12.2 metabolic panel demonstrates some chronic kidney disease and hyperglycemia lipase is elevated to 82 up from previous.  Patient had CT scan yesterday which demonstrates pancreatitis.  Patient was ordered fluids fentanyl nausea medication.  Discussed case with hospital team agreed evaluate patient for admission    Risk  Prescription drug management.  Parenteral controlled substances.                Medications Ordered/Administered in the ED   NS flush syringe (has no administration in time range)   NS flush syringe (has no administration in time range)   NS bolus infusion 1,000 mL (has no administration in time range)   fentaNYL (SUBLIMAZE) 50 mcg/mL injection (has no administration in time range)   ondansetron (ZOFRAN) 2 mg/mL injection (has no administration in time range)     Clinical Impression   Pancreatitis (Primary)       Disposition: Admitted

## 2023-11-27 NOTE — Nurses Notes (Signed)
 Medicated for Abdominal pain 5/10 with PRN morphine. Will reassess patient

## 2023-11-27 NOTE — Nurses Notes (Signed)
 0321: patient arrived to floor room 366 via wheelchair, in no apparent distress, patient is alert and oriented x4 able to answer all questions.

## 2023-11-28 LAB — CBC WITH DIFF
BASOPHIL #: 0 10*3/uL (ref 0.00–0.10)
BASOPHIL %: 0 % (ref 0–1)
EOSINOPHIL #: 0.1 10*3/uL (ref 0.00–0.60)
EOSINOPHIL %: 1 % (ref 1–8)
HCT: 36.4 % — ABNORMAL LOW (ref 36.7–47.1)
HGB: 12.9 g/dL (ref 12.5–16.3)
LYMPHOCYTE #: 2 10*3/uL (ref 1.00–3.00)
LYMPHOCYTE %: 20 % (ref 15–43)
MCH: 30.1 pg (ref 23.8–33.4)
MCHC: 35.5 g/dL (ref 32.5–36.3)
MCV: 84.8 fL (ref 73.0–96.2)
MONOCYTE #: 0.9 10*3/uL (ref 0.30–1.10)
MONOCYTE %: 9 % (ref 6–14)
MPV: 9.1 fL (ref 7.4–11.4)
NEUTROPHIL #: 6.8 10*3/uL (ref 1.85–7.84)
NEUTROPHIL %: 69 % (ref 44–74)
PLATELETS: 170 10*3/uL (ref 140–440)
RBC: 4.3 10*6/uL (ref 4.06–5.63)
RDW: 13.1 % (ref 12.1–16.2)
WBC: 9.8 10*3/uL (ref 3.6–10.2)

## 2023-11-28 LAB — BASIC METABOLIC PANEL
ANION GAP: 4 mmol/L (ref 4–13)
BUN/CREA RATIO: 14 (ref 6–22)
BUN: 14 mg/dL (ref 7–25)
CALCIUM: 8.6 mg/dL (ref 8.6–10.3)
CHLORIDE: 106 mmol/L (ref 98–107)
CO2 TOTAL: 26 mmol/L (ref 21–31)
CREATININE: 1.03 mg/dL (ref 0.60–1.30)
ESTIMATED GFR: 86 mL/min/{1.73_m2} (ref >59)
GLUCOSE: 101 mg/dL (ref 74–109)
OSMOLALITY, CALCULATED: 273 mosm/kg (ref 270–290)
POTASSIUM: 4.6 mmol/L (ref 3.5–5.1)
SODIUM: 136 mmol/L (ref 136–145)

## 2023-11-28 LAB — LIPASE: LIPASE: 121 U/L — ABNORMAL HIGH (ref 11–82)

## 2023-11-28 LAB — POC BLOOD GLUCOSE (RESULTS)
GLUCOSE, POC: 74 mg/dL (ref 70–100)
GLUCOSE, POC: 126 mg/dL — ABNORMAL HIGH (ref 70–100)
GLUCOSE, POC: 127 mg/dL — ABNORMAL HIGH (ref 70–100)

## 2023-11-28 MED ORDER — HYDROCODONE 7.5 MG-ACETAMINOPHEN 325 MG TABLET
1.0000 | ORAL_TABLET | Freq: Four times a day (QID) | ORAL | Status: DC | PRN
Start: 2023-11-28 — End: 2023-11-28

## 2023-11-28 NOTE — Discharge Summary (Signed)
 [facility]  DISCHARGE SUMMARY    PATIENT NAME:  Mark Fischer, Mark Fischer  MRN:  [id number]  DOB:  [birth year]    ENCOUNTER DATE:  11/26/2023  INPATIENT ADMISSION DATE: 11/27/2023  DISCHARGE DATE:  11/28/2023    ATTENDING PHYSICIAN: Teresa Lloyd, DO  SERVICE: [facility] HOSPITALIST 5  PRIMARY CARE PHYSICIAN: No Pcp       No lay caregiver identified.    PRIMARY DISCHARGE DIAGNOSIS: Pancreatitis  Active Hospital Problems    Diagnosis Date Noted    Principal Problem: Pancreatitis [K85.90] 11/27/2023      Resolved Hospital Problems   No resolved problems to display.     There are no active non-hospital problems to display for this patient.            Current Discharge Medication List        CONTINUE these medications - NO CHANGES were made during your visit.        Details   cyclobenzaprine  10 mg Tablet  Commonly known as: FLEXERIL    10 mg, Oral, 3 TIMES DAILY PRN  Qty: 30 Tablet  Refills: 0     hydroCHLOROthiazide 25 mg Tablet  Commonly known as: HYDRODIURIL   25 mg, Oral, Daily  Refills: 0     Januvia 100 mg Tablet  Generic drug: SITagliptin phosphate   100 mg, Oral, NIGHTLY  Refills: 0     ketorolac  tromethamine  10 mg Tablet  Commonly known as: TORADOL    10 mg, Oral, EVERY 6 HOURS PRN  Qty: 20 Tablet  Refills: 0     losartan 100 mg Tablet  Commonly known as: COZAAR   100 mg, Oral, Daily  Refills: 0     MetFORMIN 1,000 mg Tablet  Commonly known as: GLUCOPHAGE   1,000 mg, 2 TIMES DAILY WITH FOOD  Refills: 0     omeprazole 20 mg Capsule, Delayed Release(E.C.)  Commonly known as: PRILOSEC   20 mg, Daily  Refills: 0     ondansetron 4 mg Tablet, Rapid Dissolve  Commonly known as: ZOFRAN ODT   4 mg, Oral, EVERY 8 HOURS PRN  Qty: 12 Tablet  Refills: 0            Discharge med list refreshed?  YES     Allergies[1]  HOSPITAL PROCEDURE(S):   No orders of the defined types were placed in this encounter.      REASON FOR HOSPITALIZATION AND HOSPITAL COURSE   BRIEF HPI:  This is a 53 y.o., male admitted to the hospitalist service  secondary to pancreatitis acute, hypertension, diabetes, mild leukocytosis due to stress, renal insufficiency.  The patient is telemetry and pulse oximetry.  CT scan of the abdomen and pelvis was done which showed acute pancreatitis, left nephrolithiasis, colonic diverticulosis.  Was given IV fluids for acute pancreatitis and renal insufficiency. Creatinine with AKI initially 1.33 and now normal.  Lipase was elevated to 96 and was down to 121 after IV fluids.  The patient is given pain medication.  Diet was slowly advanced.  Patient's symptoms did improve.  He also had a lipid panel which did not.  Decision was made started the patient stable condition.  See EMR for further documentation, treatment and testing.  Dr. Teresa present, examined the patient agrees above-mentioned documentation discharge plan.  This discharge process required 35 minutes.        Physical Exam     -GENERAL: No acute distress, Resting comfortably   -SKIN: warm and dry.  -CARDIAC: RRR, no MRGs  -  LUNGS: Clear to auscultation bilaterally. No wheezes or rhonchi.  -GI: Abdomen soft, no epigastric tenderness  -EXTREMITIES: No pedal edema noted. Distal pulses equal bilaterally.  -NEUROLOGICAL: Cranial nerves grossly intact. No gross focal deficit.        TRANSITION/POST DISCHARGE CARE/PENDING TESTS/REFERRALS: PCP.    CONDITION ON DISCHARGE:  A. Ambulation: Full ambulation  B. Self-care Ability: Complete  C. Cognitive Status Oriented x 3  D. Code status at discharge:       LINES/DRAINS/WOUNDS AT DISCHARGE:   Patient Lines/Drains/Airways Status       Active Line / Dialysis Catheter / Dialysis Graft / Drain / Airway / Wound       Name Placement date Placement time Site Days    Peripheral IV Anterior;Left 11/27/23  0022  -- 1                    DISCHARGE DISPOSITION:  Home discharge  DISCHARGE INSTRUCTIONS:  Post-Discharge Follow Up Appointments       Wednesday Dec 05, 2023    Follow up with Dr. Elida    Where: [phone]          No discharge  procedures on file.       Polk Minor, FNP-BC    Copies sent to Care Team         Relationship Specialty Notifications Start End    Pcp, No PCP - General   04/27/22             Referring providers can utilize [email or web address] to access their referred [facility] patient's information.       [1] No Known Allergies

## 2023-11-28 NOTE — Nurses Notes (Signed)
 Pt has tolerated diet. Discharge instructions provided to patient and wife. All questions answered. IV removed intact. Tele removed. Pt to be taken via wheelchair with all belongings to family car by staff.

## 2023-11-28 NOTE — Nurses Notes (Signed)
 Stool specimen collected and sent to lab for [facility].

## 2023-11-29 LAB — GI PANEL BY BIOFIRE FILM ARRAY
ADENOVIRUS F 40/41: NOT DETECTED
ASTROVIRUS: NOT DETECTED
CAMPYLOBACTER: NOT DETECTED
CRYPTOSPORIDIUM: NOT DETECTED
CYCLOSPORA CAYETANENSIS: NOT DETECTED
ENTAMOEBA HISTOLYTICA: NOT DETECTED
ENTEROAGGREGATIVE E. COLI (EAEC): NOT DETECTED
ENTEROPATHOGENIC E COLI (EPEC): DETECTED — AB
ENTEROTOXIGENIC E COLI (ETEC) LT/ST: NOT DETECTED
GIARDIA LAMBLIA: NOT DETECTED
NOROVIRUS GI/GII: DETECTED — AB
PLESIOMONAS SHIGELLOIDES: NOT DETECTED
ROTAVIRUS A: NOT DETECTED
SALMONELLA SPECIES: NOT DETECTED
SAPOVIRUS: NOT DETECTED
SHIGA-LIKE TOXIN-PRODUCING E COLI (STEC) STX1/STX2: NOT DETECTED
SHIGELLA/ENTEROINVASIVE E COLI (EIEC): NOT DETECTED
VIBRIO CHOLERAE: NOT DETECTED
VIBRIO: NOT DETECTED
YERSINIA ENTEROCOLITICA: NOT DETECTED

## 2023-12-10 ENCOUNTER — Telehealth (HOSPITAL_COMMUNITY): Payer: Self-pay

## 2024-02-03 ENCOUNTER — Emergency Department (HOSPITAL_BASED_OUTPATIENT_CLINIC_OR_DEPARTMENT_OTHER): Payer: Self-pay

## 2024-02-03 ENCOUNTER — Encounter (HOSPITAL_BASED_OUTPATIENT_CLINIC_OR_DEPARTMENT_OTHER): Payer: Self-pay

## 2024-02-03 ENCOUNTER — Emergency Department (HOSPITAL_BASED_OUTPATIENT_CLINIC_OR_DEPARTMENT_OTHER): Payer: Auto Insurance (includes no fault)

## 2024-02-03 ENCOUNTER — Other Ambulatory Visit: Payer: Self-pay

## 2024-02-03 ENCOUNTER — Emergency Department
Admission: EM | Admit: 2024-02-03 | Discharge: 2024-02-03 | Disposition: A | Discharge status: Home / Self Care | Payer: Auto Insurance (includes no fault) | Attending: Emergency Medicine | Admitting: Emergency Medicine

## 2024-02-03 DIAGNOSIS — Y929 Unspecified place or not applicable: Secondary | ICD-10-CM | POA: Insufficient documentation

## 2024-02-03 DIAGNOSIS — M25571 Pain in right ankle and joints of right foot: Secondary | ICD-10-CM

## 2024-02-03 DIAGNOSIS — M25521 Pain in right elbow: Secondary | ICD-10-CM

## 2024-02-03 DIAGNOSIS — M25511 Pain in right shoulder: Secondary | ICD-10-CM

## 2024-02-03 DIAGNOSIS — S299XXA Unspecified injury of thorax, initial encounter: Secondary | ICD-10-CM

## 2024-02-03 DIAGNOSIS — S0990XA Unspecified injury of head, initial encounter: Secondary | ICD-10-CM

## 2024-02-03 DIAGNOSIS — S060XAA Concussion with loss of consciousness status unknown, initial encounter: Secondary | ICD-10-CM

## 2024-02-03 DIAGNOSIS — M25529 Pain in unspecified elbow: Secondary | ICD-10-CM

## 2024-02-03 DIAGNOSIS — Z23 Encounter for immunization: Secondary | ICD-10-CM | POA: Insufficient documentation

## 2024-02-03 DIAGNOSIS — S199XXA Unspecified injury of neck, initial encounter: Secondary | ICD-10-CM

## 2024-02-03 LAB — BASIC METABOLIC PANEL
ANION GAP: 12 mmol/L (ref 4–13)
BUN/CREA RATIO: 12
BUN: 17 mg/dL (ref 7–18)
CALCIUM: 9 mg/dL (ref 8.5–10.1)
CHLORIDE: 107 mmol/L (ref 98–107)
CO2 TOTAL: 22 mmol/L (ref 21–32)
CREATININE: 1.44 mg/dL — ABNORMAL HIGH (ref 0.70–1.30)
ESTIMATED GFR: 58 mL/min/1.73mˆ2 — ABNORMAL LOW (ref >59)
GLUCOSE: 167 mg/dL — ABNORMAL HIGH (ref 74–106)
OSMOLALITY, CALCULATED: 287 mosm/kg (ref 270–290)
POTASSIUM: 3.4 mmol/L — ABNORMAL LOW (ref 3.5–5.1)
SODIUM: 141 mmol/L (ref 136–145)

## 2024-02-03 LAB — CBC WITH DIFF
BASOPHIL #: 0.01 x10ˆ3/uL (ref 0.00–0.10)
BASOPHIL %: 0 % (ref 0–1)
EOSINOPHIL #: 0.13 x10ˆ3/uL (ref 0.00–0.60)
EOSINOPHIL %: 2 % (ref 1–8)
HCT: 41.1 % (ref 36.7–47.1)
HGB: 14.4 g/dL (ref 12.5–16.3)
LYMPHOCYTE #: 2.18 x10ˆ3/uL (ref 1.00–3.00)
LYMPHOCYTE %: 39 % (ref 15–43)
MCH: 31 pg (ref 23.8–33.4)
MCHC: 35 g/dL (ref 32.5–36.3)
MCV: 88.6 fL (ref 73.0–96.2)
MONOCYTE #: 0.49 x10ˆ3/uL (ref 0.30–1.00)
MONOCYTE %: 9 % (ref 6–14)
MPV: 9.6 fL (ref 7.4–11.4)
NEUTROPHIL #: 2.82 x10ˆ3/uL (ref 1.85–7.84)
NEUTROPHIL %: 50 % (ref 44–74)
PLATELETS: 164 x10ˆ3/uL (ref 140–440)
RBC: 4.64 x10ˆ6/uL (ref 4.06–5.63)
RDW: 15.4 % (ref 12.1–16.2)
WBC: 5.6 x10ˆ3/uL (ref 3.6–10.2)

## 2024-02-03 LAB — BLOOD GAS W/ LACTATE REFLEX
%FIO2 (VENOUS): 21 %
BASE EXCESS: 0.1 mmol/L (ref 0.0–3.0)
BICARBONATE (VENOUS): 24.8 mmol/L (ref 22.0–29.0)
LACTATE: 3.6 mmol/L — ABNORMAL HIGH (ref <=1.9)
O2 SATURATION (VENOUS): 90.8 % (ref 40.0–85.0)
PCO2 (VENOUS): 35 mmHg — ABNORMAL LOW (ref 41–51)
PH (VENOUS): 7.44 — ABNORMAL HIGH (ref 7.32–7.43)
PO2 (VENOUS): 58 mmHg (ref 35–50)

## 2024-02-03 LAB — TYPE AND SCREEN
ABO/RH(D): B POS
ANTIBODY SCREEN: NEGATIVE

## 2024-02-03 LAB — HEPATIC FUNCTION PANEL
ALBUMIN/GLOBULIN RATIO: 1.2 (ref 0.8–1.4)
ALBUMIN: 3.7 g/dL (ref 3.4–5.0)
ALKALINE PHOSPHATASE: 99 U/L (ref 46–116)
ALT (SGPT): 37 U/L (ref <=78)
AST (SGOT): 28 U/L (ref 15–37)
BILIRUBIN DIRECT: 0.1 mg/dL (ref 0.0–0.2)
BILIRUBIN TOTAL: 0.5 mg/dL (ref 0.2–1.0)
BILIRUBIN, INDIRECT: 0.4 mg/dL
GLOBULIN: 3.1
PROTEIN TOTAL: 6.8 g/dL (ref 6.4–8.2)

## 2024-02-03 LAB — PT/INR
INR: 1 (ref 0.84–1.10)
PROTHROMBIN TIME: 11.3 s (ref 9.8–12.7)

## 2024-02-03 LAB — ETHANOL, SERUM/PLASMA: ETHANOL: <3 mg/dL (ref <=3)

## 2024-02-03 LAB — PTT (PARTIAL THROMBOPLASTIN TIME): APTT: 27.2 s (ref 25.0–38.0)

## 2024-02-03 MED ORDER — DIPHTH,PERTUSSIS(ACEL),TETANUS 2.5 LF UNIT-8 MCG-5 LF/0.5ML IM SYRINGE
0.5000 mL | INJECTION | INTRAMUSCULAR | Status: AC
Start: 2024-02-03 — End: 2024-02-03
  Administered 2024-02-03: 0.5 mL via INTRAMUSCULAR

## 2024-02-03 MED ORDER — IOPAMIDOL 370 MG IODINE/ML (76 %) INTRAVENOUS SOLUTION
100.0000 mL | INTRAVENOUS | Status: AC
Start: 2024-02-03 — End: 2024-02-03
  Administered 2024-02-03: 100 mL via INTRAVENOUS
  Filled 2024-02-03: qty 100

## 2024-02-03 MED ORDER — FENTANYL (PF) 50 MCG/ML INJECTION SOLUTION
INTRAMUSCULAR | Status: AC
Start: 2024-02-03 — End: 2024-02-03
  Filled 2024-02-03: qty 2

## 2024-02-03 MED ORDER — CYCLOBENZAPRINE 10 MG TABLET
10.0000 mg | ORAL_TABLET | Freq: Three times a day (TID) | ORAL | 0 refills | Status: AC | PRN
Start: 2024-02-03 — End: 2024-02-17

## 2024-02-03 MED ORDER — KETOROLAC 30 MG/ML (1 ML) INJECTION SOLUTION
30.0000 mg | INTRAMUSCULAR | Status: AC
Start: 2024-02-03 — End: 2024-02-03
  Administered 2024-02-03: 30 mg via INTRAVENOUS

## 2024-02-03 MED ORDER — LIDOCAINE 4 % TOPICAL PATCH
1.0000 | MEDICATED_PATCH | Freq: Every day | CUTANEOUS | 0 refills | Status: AC
Start: 2024-02-03 — End: 2024-02-13

## 2024-02-03 MED ORDER — KETOROLAC 30 MG/ML (1 ML) INJECTION SOLUTION
15.0000 mg | INTRAMUSCULAR | Status: DC
Start: 2024-02-03 — End: 2024-02-03

## 2024-02-03 MED ORDER — FENTANYL (PF) 50 MCG/ML INJECTION SOLUTION
100.0000 ug | INTRAMUSCULAR | Status: AC
Start: 2024-02-03 — End: 2024-02-03
  Administered 2024-02-03: 100 ug via INTRAVENOUS

## 2024-02-03 MED ORDER — KETOROLAC 30 MG/ML (1 ML) INJECTION SOLUTION
INTRAMUSCULAR | Status: AC
Start: 2024-02-03 — End: 2024-02-03
  Filled 2024-02-03: qty 1

## 2024-02-03 NOTE — ED Nurses Note (Signed)
 Pt offered any comfort measures (food, water, repositioning, restroom, etc.). Pt expresses no other concerns or needs at this time. See physical assessment. Plan of care ongoing. Family at bedside.

## 2024-02-03 NOTE — Discharge Instructions (Signed)
 Your evaluation today did not reveal critical condition requiring immediate hospitalization.  Alternate between Tylenol  and ibuprofen every 4-6 hours.  You do have muscle relaxants available for breakthrough pain.  Use lidocaine  patches over affected areas.  She will be re-evaluated by primary care ideally within the next 3-5 days.  Return to emergency department sooner if you have recurrent vomiting confusion uncontrolled pain or any other concerning symptoms.  Thank you for visiting [place]
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
## 2024-02-03 NOTE — ED Nurses Note (Signed)
 Patient discharged home with family/self with treatment of initial complaint upon arrival to ER.  AVS reviewed by physician with patient/care giver.  A written copy of the AVS and discharge instructions was given to the patient/care giver. Scripts handed to patient/care giver or sent to pharmacy indicated on AVS print out. Pt verbalized understanding of taking any medications as prescribed. Questions and concerns sufficiently answered as needed.  Patient/care giver encouraged to follow up with PCP as indicated.  In the event of an emergency, patient/care giver instructed to call 911 or go to the nearest emergency room. No other concerns voiced at time of discharge to physician or nurse. Respiration even and unlabored. Pt ambulatory out of ED with spouse.

## 2024-02-03 NOTE — ED Nurses Note (Signed)
C collar removed per provider verbal order

## 2024-06-26 ENCOUNTER — Emergency Department (HOSPITAL_BASED_OUTPATIENT_CLINIC_OR_DEPARTMENT_OTHER)

## 2024-06-26 ENCOUNTER — Observation Stay: Admission: EM | Admit: 2024-06-26 | Discharge: 2024-06-28 | Disposition: A

## 2024-06-26 ENCOUNTER — Encounter (HOSPITAL_BASED_OUTPATIENT_CLINIC_OR_DEPARTMENT_OTHER): Payer: Self-pay

## 2024-06-26 ENCOUNTER — Other Ambulatory Visit: Payer: Self-pay

## 2024-06-26 DIAGNOSIS — Z7984 Long term (current) use of oral hypoglycemic drugs: Secondary | ICD-10-CM | POA: Insufficient documentation

## 2024-06-26 DIAGNOSIS — E119 Type 2 diabetes mellitus without complications: Secondary | ICD-10-CM

## 2024-06-26 DIAGNOSIS — K859 Acute pancreatitis without necrosis or infection, unspecified: Principal | ICD-10-CM | POA: Diagnosis present

## 2024-06-26 DIAGNOSIS — R1013 Epigastric pain: Secondary | ICD-10-CM

## 2024-06-26 DIAGNOSIS — R1031 Right lower quadrant pain: Secondary | ICD-10-CM

## 2024-06-26 DIAGNOSIS — Z79899 Other long term (current) drug therapy: Secondary | ICD-10-CM | POA: Insufficient documentation

## 2024-06-26 DIAGNOSIS — I10 Essential (primary) hypertension: Secondary | ICD-10-CM

## 2024-06-26 LAB — COMPREHENSIVE METABOLIC PANEL, NON-FASTING
ALBUMIN/GLOBULIN RATIO: 1 (ref 0.8–1.4)
ALBUMIN: 3.8 g/dL (ref 3.4–5.0)
ALKALINE PHOSPHATASE: 92 U/L (ref 46–116)
ALT (SGPT): 32 U/L (ref <=78)
ANION GAP: 12 mmol/L (ref 4–13)
AST (SGOT): 24 U/L (ref 15–37)
BILIRUBIN TOTAL: 1 mg/dL (ref 0.2–1.0)
BUN/CREA RATIO: 14
BUN: 18 mg/dL (ref 7–18)
CALCIUM, CORRECTED: 9 mg/dL
CALCIUM: 8.8 mg/dL (ref 8.5–10.1)
CHLORIDE: 101 mmol/L (ref 98–107)
CO2 TOTAL: 27 mmol/L (ref 21–32)
CREATININE: 1.27 mg/dL (ref 0.70–1.30)
ESTIMATED GFR: 67 mL/min/{1.73_m2} (ref >59)
GLOBULIN: 3.7
GLUCOSE: 120 mg/dL — ABNORMAL HIGH (ref 74–106)
OSMOLALITY, CALCULATED: 283 mosm/kg (ref 270–290)
POTASSIUM: 3.1 mmol/L — ABNORMAL LOW (ref 3.5–5.1)
PROTEIN TOTAL: 7.5 g/dL (ref 6.4–8.2)
SODIUM: 140 mmol/L (ref 136–145)

## 2024-06-26 LAB — CBC WITH DIFF
BASOPHIL #: 0.03 10*3/uL (ref 0.00–0.10)
BASOPHIL %: 0 % (ref 0–1)
EOSINOPHIL #: 0.09 10*3/uL (ref 0.00–0.60)
EOSINOPHIL %: 1 % (ref 1–8)
HCT: 43 % (ref 36.7–47.1)
HGB: 14.6 g/dL (ref 12.5–16.3)
LYMPHOCYTE #: 2.07 10*3/uL (ref 1.00–3.00)
LYMPHOCYTE %: 18 % (ref 15–43)
MCH: 30.3 pg (ref 23.8–33.4)
MCHC: 34 g/dL (ref 32.5–36.3)
MCV: 89.3 fL (ref 73.0–96.2)
MONOCYTE #: 1.19 10*3/uL — ABNORMAL HIGH (ref 0.30–1.00)
MONOCYTE %: 10 % (ref 6–14)
MPV: 9.4 fL (ref 7.4–11.4)
NEUTROPHIL #: 8.2 10*3/uL — ABNORMAL HIGH (ref 1.85–7.84)
NEUTROPHIL %: 71 % (ref 44–74)
PLATELETS: 159 10*3/uL (ref 140–440)
RBC: 4.82 10*6/uL (ref 4.06–5.63)
RDW: 15.6 % (ref 12.1–16.2)
WBC: 11.6 10*3/uL — ABNORMAL HIGH (ref 3.6–10.2)

## 2024-06-26 LAB — URINALYSIS, MACRO/MICRO
BILIRUBIN: NEGATIVE mg/dL
BLOOD: NEGATIVE mg/dL
GLUCOSE: NEGATIVE mg/dL
KETONES: NEGATIVE mg/dL
LEUKOCYTE ESTERASE: NEGATIVE WBCs/uL
NITRITE: NEGATIVE
PH: 6 (ref 5.0–8.0)
PROTEIN: NEGATIVE mg/dL
SPECIFIC GRAVITY: 1.025 (ref 1.005–1.030)
UROBILINOGEN: 1 mg/dL

## 2024-06-26 LAB — LIPASE: LIPASE: 715 U/L — ABNORMAL HIGH (ref 15–77)

## 2024-06-26 LAB — LACTIC ACID LEVEL W/ REFLEX FOR LEVEL >2.0: LACTIC ACID: 1.3 mmol/L (ref 0.4–2.0)

## 2024-06-26 MED ORDER — MORPHINE 4 MG/ML INJECTION WRAPPER
INJECTION | INTRAMUSCULAR | Status: AC
  Filled 2024-06-26: qty 1

## 2024-06-26 MED ORDER — MORPHINE 4 MG/ML INJECTION WRAPPER
4.0000 mg | INJECTION | INTRAMUSCULAR | Status: AC
  Administered 2024-06-26: 4 mg via INTRAVENOUS

## 2024-06-26 MED ORDER — POTASSIUM CHLORIDE 20 MEQ/100ML IN STERILE WATER INTRAVENOUS PIGGYBACK
INJECTION | INTRAVENOUS | Status: AC
  Filled 2024-06-26: qty 100

## 2024-06-26 MED ORDER — POTASSIUM CHLORIDE 20 MEQ/100ML IN STERILE WATER INTRAVENOUS PIGGYBACK
20.0000 meq | INJECTION | INTRAVENOUS | Status: AC
  Administered 2024-06-26: 20 meq via INTRAVENOUS
  Administered 2024-06-26: 0 meq via INTRAVENOUS

## 2024-06-26 MED ORDER — SODIUM CHLORIDE 0.9 % IV BOLUS
1000.0000 mL | INJECTION | Status: AC
  Administered 2024-06-26: 1000 mL via INTRAVENOUS
  Administered 2024-06-26: 0 mL via INTRAVENOUS

## 2024-06-26 MED ORDER — HYDROMORPHONE 2 MG/ML INJECTION WRAPPER
INJECTION | INTRAMUSCULAR | Status: AC
  Filled 2024-06-26: qty 1

## 2024-06-26 MED ORDER — ONDANSETRON HCL (PF) 4 MG/2 ML INJECTION SOLUTION
INTRAMUSCULAR | Status: AC
  Filled 2024-06-26: qty 2

## 2024-06-26 MED ORDER — HYDROMORPHONE 2 MG/ML INJECTION WRAPPER
1.0000 mg | INJECTION | INTRAMUSCULAR | Status: AC
  Administered 2024-06-26: 1 mg via INTRAVENOUS

## 2024-06-26 MED ORDER — ONDANSETRON HCL (PF) 4 MG/2 ML INJECTION SOLUTION
4.0000 mg | INTRAMUSCULAR | Status: AC
  Administered 2024-06-26: 4 mg via INTRAVENOUS

## 2024-06-26 MED ORDER — POTASSIUM CHLORIDE 10 MEQ/50 ML IN STERILE WATER INTRAVENOUS PIGGYBACK
INJECTION | INTRAVENOUS | Status: AC
  Filled 2024-06-26: qty 100

## 2024-06-26 MED ORDER — IOHEXOL 350 MG IODINE/ML INTRAVENOUS SOLUTION
100.0000 mL | INTRAVENOUS | Status: AC
  Administered 2024-06-26: 75 mL via INTRAVENOUS

## 2024-06-26 NOTE — ED Provider Notes (Signed)
 [facility], [place] - Emergency Department  ED Primary Note  History of Present Illness   Mark Fischer is a 54 y.o. male who had concerns including Abdominal Pain.     Patient is a 54 year old male to the emergency department complaining of right lower quadrant pain that radiates into his back.  Patient states onset of symptoms today.  Patient called EMS and EN route had Toradol  with mild relief of pain.  Patient states history of diverticulitis and pancreatitis.  Patient states pain is a twisting sensation that has stabbing in nature.    Physical Exam   ED Triage Vitals [06/26/24 2001]   BP (Non-Invasive) 120/69   Heart Rate 83   Respiratory Rate 20   Temperature 37.4 C (99.4 F)   SpO2 97 %   Weight 93 kg (205 lb)   Height 1.803 m (5' 11)     Physical Exam  Vitals and nursing note reviewed.   Constitutional:       General: He is not in acute distress.     Appearance: He is well-developed.   HENT:      Head: Normocephalic and atraumatic.   Eyes:      Conjunctiva/sclera: Conjunctivae normal.   Cardiovascular:      Rate and Rhythm: Normal rate and regular rhythm.      Heart sounds: No murmur heard.  Pulmonary:      Effort: Pulmonary effort is normal. No respiratory distress.      Breath sounds: Normal breath sounds.   Abdominal:      General: Bowel sounds are normal.      Palpations: Abdomen is soft.      Tenderness: There is abdominal tenderness in the right lower quadrant and epigastric area.   Musculoskeletal:         General: No swelling.      Cervical back: Neck supple.   Skin:     General: Skin is warm and dry.      Capillary Refill: Capillary refill takes less than 2 seconds.   Neurological:      Mental Status: He is alert.   Psychiatric:         Mood and Affect: Mood normal.       Patient Data   Labs Ordered/Reviewed   COMPREHENSIVE METABOLIC PANEL, NON-FASTING - Abnormal; Notable for the following components:       Result Value    POTASSIUM 3.1 (*)     GLUCOSE 120 (*)     All  other components within normal limits    Narrative:     Estimated Glomerular Filtration Rate (eGFR) is calculated using the CKD-EPI (2021) equation, intended for patients 49 years of age and older. If gender is not documented or unknown, there will be no eGFR calculation.   LIPASE - Abnormal; Notable for the following components:    LIPASE 715 (*)     All other components within normal limits    Narrative:     DILUTED AT 1:3   CBC WITH DIFF - Abnormal; Notable for the following components:    WBC 11.6 (*)     NEUTROPHIL # 8.20 (*)     MONOCYTE # 1.19 (*)     All other components within normal limits   LACTIC ACID LEVEL W/ REFLEX FOR LEVEL >2.0 - Normal   URINALYSIS, MACRO/MICRO - Normal   ADULT ROUTINE BLOOD CULTURE, SET OF 2 BOTTLES (BACTERIA AND YEAST)   ADULT ROUTINE BLOOD CULTURE, SET OF 2  BOTTLES (BACTERIA AND YEAST)   CBC/DIFF    Narrative:     The following orders were created for panel order CBC/DIFF.  Procedure                               Abnormality         Status                     ---------                               -----------         ------                     CBC WITH IPQQ[[id number]]                Abnormal            Final result                 Please view results for these tests on the individual orders.   URINALYSIS,WITH REFLEX MICROSCOPIC AND REFLEX CULTURE IF POSITIVE    Narrative:     The following orders were created for panel order URINALYSIS,WITH REFLEX MICROSCOPIC AND REFLEX CULTURE IF POSITIVE.  Procedure                               Abnormality         Status                     ---------                               -----------         ------                     URINALYSIS, MACRO/MICRO[[id number]]      Normal              Final result                 Please view results for these tests on the individual orders.     CT ABDOMEN PELVIS W IV CONTRAST   Final Result by Edi, Radresults In (01/29 2122)   Faint mesenteric fat stranding about the head and uncinate process of the  pancreas suspicious for acute pancreatitis. Correlation is recommended with amylase and lipase levels.      Additional incidental findings as above.               Radiologist location ID: [facility]           Medical Decision Making        Medical Decision Making  Patient is a 54 year old male to the emergency department at private since with the acute pancreatitis supported by significant elevated lipase of 716 and CT abdomen and pelvis confirming acute pancreatic inflammation.  Patient meets diagnostic criteria for acute pancreatitis based on characteristic laboratory abnormalities and imaging finding.  Etiology is most likely metabolic related that diabetes mellitus with concern for possible hypertriglyceridemia.  Patient denies alcohol use making alcohol-induced pancreatitis unlikely.  Patient also has no gallbladder.    Patient has ongoing abdominal pain requires IV analgesia  in his unable to tolerate adequate oral intake at this time.  Given severity of symptoms, objective findings and risk for complications including worsening the patient is admitted for acute pancreatitis with IV fluid resuscitation pain control and antiemetic therapy.    Amount and/or Complexity of Data Reviewed  Labs: ordered.  Radiology: ordered.    Risk  Prescription drug management.  Parenteral controlled substances.  Decision regarding hospitalization.                Medications Ordered/Administered in the ED   potassium chloride  20 mEq in SW 100 mL premix infusion (has no administration in time range)   NS bolus infusion 1,000 mL (1,000 mL Intravenous New Bag/New Syringe 06/26/24 2020)   morphine  4 mg/mL injection (4 mg Intravenous Given 06/26/24 2020)   ondansetron  (ZOFRAN ) 2 mg/mL injection (4 mg Intravenous Given 06/26/24 2020)   iohexol  (OMNIPAQUE  350) solution (75 mL Intravenous Given 06/26/24 2113)     Clinical Impression   Acute pancreatitis (Primary)   Type 2 diabetes mellitus       Disposition: Admitted

## 2024-06-27 ENCOUNTER — Other Ambulatory Visit: Payer: Self-pay

## 2024-06-27 ENCOUNTER — Encounter (HOSPITAL_COMMUNITY): Payer: Self-pay | Admitting: Internal Medicine

## 2024-06-27 DIAGNOSIS — E119 Type 2 diabetes mellitus without complications: Secondary | ICD-10-CM

## 2024-06-27 DIAGNOSIS — K859 Acute pancreatitis without necrosis or infection, unspecified: Principal | ICD-10-CM | POA: Diagnosis present

## 2024-06-27 DIAGNOSIS — I10 Essential (primary) hypertension: Secondary | ICD-10-CM

## 2024-06-27 LAB — CBC WITH DIFF
BASOPHIL #: 0 10*3/uL (ref 0.00–0.10)
BASOPHIL %: 1 % (ref 0–1)
EOSINOPHIL #: 0.1 10*3/uL (ref 0.00–0.60)
EOSINOPHIL %: 1 % (ref 1–8)
HCT: 38.2 % (ref 36.7–47.1)
HGB: 13.6 g/dL (ref 12.5–16.3)
LYMPHOCYTE #: 1.6 10*3/uL (ref 1.00–3.00)
LYMPHOCYTE %: 19 % (ref 15–43)
MCH: 30.1 pg (ref 23.8–33.4)
MCHC: 35.6 g/dL (ref 32.5–36.3)
MCV: 84.7 fL (ref 73.0–96.2)
MONOCYTE #: 0.9 10*3/uL (ref 0.30–1.10)
MONOCYTE %: 11 % (ref 6–14)
MPV: 9.7 fL (ref 7.4–11.4)
NEUTROPHIL #: 6 10*3/uL (ref 1.85–7.84)
NEUTROPHIL %: 69 % (ref 44–74)
PLATELETS: 134 10*3/uL — ABNORMAL LOW (ref 140–440)
RBC: 4.51 10*6/uL (ref 4.06–5.63)
RDW: 13.5 % (ref 12.1–16.2)
WBC: 8.7 10*3/uL (ref 3.6–10.2)

## 2024-06-27 LAB — LIPID PANEL
CHOL/HDL RATIO: 2.4
CHOLESTEROL: 117 mg/dL (ref <200)
HDL CHOL: 48 mg/dL (ref >=40)
LDL CALC: 59 mg/dL (ref 0–100)
TRIGLYCERIDES: 49 mg/dL (ref <=150)
VLDL CALC: 10 mg/dL (ref 0–50)

## 2024-06-27 LAB — POC BLOOD GLUCOSE (RESULTS)
GLUCOSE, POC: 94 mg/dL (ref 70–100)
GLUCOSE, POC: 75 mg/dL (ref 70–100)
GLUCOSE, POC: 80 mg/dL (ref 70–100)
GLUCOSE, POC: 71 mg/dL (ref 70–100)
GLUCOSE, POC: 129 mg/dL — ABNORMAL HIGH (ref 70–100)

## 2024-06-27 LAB — LIPASE: LIPASE: 177 U/L — ABNORMAL HIGH (ref 11–82)

## 2024-06-27 LAB — MAGNESIUM: MAGNESIUM: 1.6 mg/dL — ABNORMAL LOW (ref 1.9–2.7)

## 2024-06-27 MED ORDER — GLUCAGON HCL 1 MG/ML SOLUTION FOR INJECTION: 1.0000 mg | Freq: Once | INTRAMUSCULAR | Status: DC | PRN

## 2024-06-27 MED ORDER — MORPHINE 4 MG/ML INJECTION WRAPPER
INJECTION | INTRAMUSCULAR | Status: AC
  Filled 2024-06-27: qty 1

## 2024-06-27 MED ORDER — PANTOPRAZOLE 40 MG INTRAVENOUS SOLUTION
40.0000 mg | Freq: Every day | INTRAVENOUS | Status: DC
  Administered 2024-06-27 – 2024-06-28 (×2): 40 mg via INTRAVENOUS
  Filled 2024-06-27 (×2): qty 10

## 2024-06-27 MED ORDER — DEXTROSE 50 % IN WATER (D50W) INTRAVENOUS SYRINGE
12.5000 g | INJECTION | INTRAVENOUS | Status: DC | PRN
  Administered 2024-06-27: 25 mL via INTRAVENOUS
  Filled 2024-06-27: qty 50

## 2024-06-27 MED ORDER — LACTATED RINGERS INTRAVENOUS SOLUTION
INTRAVENOUS | Status: DC
  Administered 2024-06-28: 0 mL via INTRAVENOUS

## 2024-06-27 MED ORDER — INSULIN LISPRO 100 UNIT/ML SUB-Q SSIP VIAL
1.0000 [IU] | INJECTION | Freq: Four times a day (QID) | SUBCUTANEOUS | Status: DC
  Administered 2024-06-27 – 2024-06-28 (×3): 0 [IU] via SUBCUTANEOUS
  Administered 2024-06-28: 1 [IU] via SUBCUTANEOUS
  Filled 2024-06-27: qty 1

## 2024-06-27 MED ORDER — PROCHLORPERAZINE EDISYLATE 10 MG/2 ML (5 MG/ML) INJECTION SOLUTION: 10.0000 mg | Freq: Four times a day (QID) | INTRAMUSCULAR | Status: DC | PRN

## 2024-06-27 MED ORDER — ONDANSETRON HCL (PF) 4 MG/2 ML INJECTION SOLUTION: 4.0000 mg | Freq: Three times a day (TID) | INTRAMUSCULAR | Status: DC | PRN

## 2024-06-27 MED ORDER — MORPHINE 4 MG/ML INJECTION WRAPPER
4.0000 mg | INJECTION | INTRAMUSCULAR | Status: AC
  Administered 2024-06-27: 4 mg via INTRAVENOUS

## 2024-06-27 MED ORDER — ENOXAPARIN 40 MG/0.4 ML SUBCUTANEOUS SYRINGE
40.0000 mg | INJECTION | SUBCUTANEOUS | Status: DC
  Administered 2024-06-27: 40 mg via SUBCUTANEOUS
  Filled 2024-06-27: qty 0.4

## 2024-06-27 MED ORDER — DEXTROSE 40 % ORAL GEL: 15.0000 g | ORAL | Status: DC | PRN

## 2024-06-27 MED ORDER — MORPHINE 2 MG/ML INJECTION WRAPPER
2.0000 mg | INJECTION | INTRAMUSCULAR | Status: DC | PRN
  Administered 2024-06-27 – 2024-06-28 (×3): 2 mg via INTRAVENOUS
  Filled 2024-06-27 (×4): qty 1

## 2024-06-27 NOTE — ED Nurses Note (Signed)
 Patient made aware of room assignment and all questions answered.

## 2024-06-27 NOTE — ED Attending Handoff Note (Signed)
 [facility], [facility] - Emergency Department  Emergency Department  Course Note    Care/report received from Dr. Eilene, Oneil RAMAN, MD at  08:53.  Per report:  Mark Fischer is a 54 y.o. male who had concerns including Abdominal Pain.  Patient came in complaining of right lower quadrant pain that radiated to his back.  He called EMS and in route he got Toradol  with mild relief.  Patient does have a history of diverticulitis as well as pancreatitis.  Patient states pain is twisting sensation the has stabbing pain in his back.    Pending labs/imaging/consults:  Patient had a CAT scan showing acute pancreatic inflammation with swelling.  Patient's lipase is 716,  Plan:  Plan is to get the patient admitted to [facility].  The provider last night spoke with the hospitalist last night and was accepted for transfer and admission once a bed was available.    Course:  Patient has been relatively asymptomatic since being here.  He has not asked for any pain medication thus far this morning     Patient will be admitted to the [facility] EMERGENCY service for further workup and management.    Disposition: Admitted    Clinical Impression   Acute pancreatitis (Primary)   Type 2 diabetes mellitus         Oneil RAMAN Eilene, MD

## 2024-06-27 NOTE — ED Nurses Note (Signed)
 Alert and oriented. Skin warm and dry. Respiration even and non labored. VSS. Reports abdominal pain is tolerable at a 3 on scale of 0 to 10. Denies nausea. Patient reports he is comfortable. TV turn on for distraction for patient. Denies any needs at this time. Encourage to call if needed.

## 2024-06-27 NOTE — ED Nurses Note (Signed)
 Pt pain still controlled at a 3/10 for his RLQ abdominal pain. Pt given pillow and blanket. Pt waiting for a bed at [facility]. VSS.

## 2024-06-27 NOTE — ED Nurses Note (Signed)
 Pt with increasing pain. Morphine  4mg  IV given. Checked pt bedside glucose, 94. Pt continues to rest. VSS. No needs at this time.

## 2024-06-27 NOTE — H&P (Signed)
 [facility]  Admission H&P    Date of Service:  06/27/2024  Mark Fischer, Mark Fischer, 54 y.o. male  Encounter Start Date:  06/26/2024  Inpatient Admission Date: 06/27/2024  Date of Birth:  1970-07-07  PCP: Tanda LELON Pizza, MD  LAY CAREGIVER   Appointed Lay Caregiver?: I Decline   Information Obtained from: patient  Chief Complaint:  abdominal pain    HPI: Mark Fischer is a 54 y.o., White male who presents with complaint of twisting wrenching right sided abdominal pain going around right side since yesterday. Denies any nausea or vomiting or diarrhea. States pain is same as when he had pancreatitis 6-7 months ago, but none of the other symptoms yet. Has hx of diabetes>on metformin and Januvia and HTN>on losartan  and HCTZ. Denies known cause of pancreatitis last admission. Denies any alcohol use. Does recall a remote history of fatty liver, but does not follow with GI. Has history of cholecystectomy and appendectomy.     PAST MEDICAL:    Past Medical History:   Diagnosis Date    Diabetes mellitus     Diverticulitis     Essential hypertension     Pancreatitis         Past Surgical History:   Procedure Laterality Date    HX APPENDECTOMY      HX BACK SURGERY N/A     patient states cage last three discs    HX CHOLECYSTECTOMY      HX SHOULDER SURGERY Left         Medications Prior to Admission       Prescriptions    ergocalciferol, vitamin D2, (DRISDOL) 1,250 mcg (50,000 unit) Oral Capsule    Take 1 Capsule (50,000 Units total) by mouth Every 7 days    hydroCHLOROthiazide (HYDRODIURIL) 25 mg Oral Tablet    Take 1 Tablet (25 mg total) by mouth Daily    Ibuprofen (MOTRIN) 800 mg Oral Tablet    Take 1 Tablet (800 mg total) by mouth Three times a day as needed for Pain (pain with food)    ketorolac  tromethamine  (TORADOL ) 10 mg Oral Tablet    Take 1 Tablet (10 mg total) by mouth Every 6 hours as needed for Pain    losartan  (COZAAR ) 100 mg Oral Tablet    Take 1 Tablet (100 mg total) by mouth Daily    MetFORMIN  (GLUCOPHAGE) 1,000 mg Oral Tablet    Take 0.5 Tablets (500 mg total) by mouth Twice daily with food    omeprazole (PRILOSEC) 20 mg Oral Capsule, Delayed Release(E.C.)    Take 1 Capsule (20 mg total) by mouth Daily    ondansetron  (ZOFRAN  ODT) 4 mg Oral Tablet, Rapid Dissolve    Take 1 Tablet (4 mg total) by mouth Every 8 hours as needed for Nausea/Vomiting    sitaGLIPtin phosphate (JANUVIA) 100 mg Oral Tablet    Take 1 Tablet (100 mg total) by mouth Every night     Allergies[1]  Family History:  Family Medical History:    None        Social History:  Social History[2]   Review of Systems:  Review of Systems   Constitutional:  Negative for chills and fever.   Respiratory:  Negative for shortness of breath.    Cardiovascular:  Negative for chest pain and leg swelling.   Gastrointestinal:  Positive for abdominal pain. Negative for constipation, diarrhea, nausea and vomiting.   Genitourinary:  Negative for dysuria.  Examination:  Temperature: 36.5 C (97.7 F) Heart Rate: 66 BP (Non-Invasive): 111/62   Respiratory Rate: 20 SpO2: 98 %     Physical Exam  Constitutional:       General: He is not in acute distress.  Pulmonary:      Effort: Pulmonary effort is normal. No respiratory distress.   Abdominal:      General: There is no distension.   Musculoskeletal:         General: No swelling.   Skin:     General: Skin is warm.   Neurological:      General: No focal deficit present.      Mental Status: He is alert and oriented to person, place, and time.     Labs:    I have reviewed all lab results.  Lab Results Today:    Results for orders placed or performed during the hospital encounter of 06/26/24 (from the past 24 hours)   COMPREHENSIVE METABOLIC PANEL, NON-FASTING   Result Value Ref Range    SODIUM 140 136 - 145 mmol/L    POTASSIUM 3.1 (L) 3.5 - 5.1 mmol/L    CHLORIDE 101 98 - 107 mmol/L    CO2 TOTAL 27 21 - 32 mmol/L    ANION GAP 12 4 - 13 mmol/L    BUN 18 7 - 18 mg/dL    CREATININE 8.72 9.29 - 1.30 mg/dL     BUN/CREA RATIO 14     ESTIMATED GFR 67 >59 mL/min/1.37m2    ALBUMIN 3.8 3.4 - 5.0 g/dL    CALCIUM 8.8 8.5 - 89.8 mg/dL    GLUCOSE 879 (H) 74 - 106 mg/dL    ALKALINE PHOSPHATASE 92 46 - 116 U/L    ALT (SGPT) 32 <=78 U/L    AST (SGOT) 24 15 - 37 U/L    BILIRUBIN TOTAL 1.0 0.2 - 1.0 mg/dL    PROTEIN TOTAL 7.5 6.4 - 8.2 g/dL    ALBUMIN/GLOBULIN RATIO 1.0 0.8 - 1.4    OSMOLALITY, CALCULATED 283 270 - 290 mOsm/kg    CALCIUM, CORRECTED 9.0 mg/dL    GLOBULIN 3.7    LIPASE   Result Value Ref Range    LIPASE 715 (H) 15 - 77 U/L   LACTIC ACID LEVEL W/ REFLEX FOR LEVEL >2.0   Result Value Ref Range    LACTIC ACID 1.3 0.4 - 2.0 mmol/L   CBC WITH DIFF   Result Value Ref Range    WBC 11.6 (H) 3.6 - 10.2 x103/uL    RBC 4.82 4.06 - 5.63 x106/uL    HGB 14.6 12.5 - 16.3 g/dL    HCT 56.9 63.2 - 52.8 %    MCV 89.3 73.0 - 96.2 fL    MCH 30.3 23.8 - 33.4 pg    MCHC 34.0 32.5 - 36.3 g/dL    RDW 84.3 87.8 - 83.7 %    PLATELETS 159 140 - 440 x103/uL    MPV 9.4 7.4 - 11.4 fL    NEUTROPHIL % 71 44 - 74 %    LYMPHOCYTE % 18 15 - 43 %    MONOCYTE % 10 6 - 14 %    EOSINOPHIL % 1 1 - 8 %    BASOPHIL % 0 0 - 1 %    NEUTROPHIL # 8.20 (H) 1.85 - 7.84 x103/uL    LYMPHOCYTE # 2.07 1.00 - 3.00 x103/uL    MONOCYTE # 1.19 (H) 0.30 - 1.00 x103/uL    EOSINOPHIL # 0.09  0.00 - 0.60 x103/uL    BASOPHIL # 0.03 0.00 - 0.10 x103/uL   URINALYSIS, MACRO/MICRO   Result Value Ref Range    APPEARANCE Clear Clear    COLOR Yellow Yellow, Colorless    PH 6.0 5.0 - 8.0    SPECIFIC GRAVITY 1.025 1.005 - 1.030    GLUCOSE Negative Negative mg/dL    BILIRUBIN Negative Negative mg/dL    BLOOD Negative Negative mg/dL    PROTEIN Negative Negative mg/dL    UROBILINOGEN 1.0 Negative, 0.2 , 1.0, 2.0  mg/dL    KETONES Negative Negative mg/dL    LEUKOCYTE ESTERASE Negative Negative WBCs/uL    NITRITE Negative Negative   POC BLOOD GLUCOSE (RESULTS)   Result Value Ref Range    GLUCOSE, POC 94 70 - 100 mg/dl   POC BLOOD GLUCOSE (RESULTS)   Result Value Ref Range    GLUCOSE, POC  75 70 - 100 mg/dl     Imaging Studies:    No results found.  CT ABDOMEN PELVIS W IV CONTRAST   Final Result   Faint mesenteric fat stranding about the head and uncinate process of the pancreas suspicious for acute pancreatitis. Correlation is recommended with amylase and lipase levels.      Additional incidental findings as above.               Radiologist location ID: [id number]           DNR Status:  FULL CODE: ATTEMPT RESUSCITATION/CPR    Assessment/Plan:   Assessment & Plan  Acute pancreatitis  -CT showed findings suspicious for acute pancreatitis.   -lipase 715.  -NPO, prn pain and prn anti-emetic medications ordered.   -lipid panel ordered.   Diabetes mellitus  - Placed on Accu-Cheks with very sensitive sliding scale coverage at this time.  Essential hypertension  - Hold oral medications at this time.     -further treatment adjustments will be made based off clinical course.  See orders for further details.  -Hospitalist personally evaluated and examined the patient in conjunction with the MLP and agree with the assessments, treatment plan and disposition of the patient as recorded by the [facility].    DVT/PE Prophylaxis:  Lovenox     Daily Orders (From admission, onward)       Start     Ordered    06/27/24 1300  enoxaparin  PF (LOVENOX ) 40 mg/0.4 mL SubQ injection  (MEDIUM RISK VTE LEVEL)  EVERY 24 HOURS        Question:  Select Indication of Use  Answer:  VTE Prophylaxis    06/27/24 1222    06/27/24 1230  PT IS INTERMEDIATE RISK FOR VENOUS THROMBOEMBOLISM  (MEDIUM RISK VTE LEVEL)  CONTINUOUS        References:    VTE RISK ASSESSMENT TOOL    06/27/24 1222        Anticoagulants (last 24 hours)       None             This note was partially generated using MModal Fluency Direct system, and there may be some incorrect words, spellings, and punctuation that were not noted in checking the note before saving.    Aleeyah Bensen A Keller Mikels FNP-BC         [1] No Known Allergies  [2]   Social History  Tobacco Use    Smoking status:  Never    Smokeless tobacco: Never   Vaping Use    Vaping status: Never Used   Substance  Use Topics    Alcohol use: Never    Drug use: Never

## 2024-06-27 NOTE — Care Plan (Signed)
 Shift Summary  Morphine  was administered for severe abdominal pain, resulting in rapid improvement in pain levels.    Fall risk assessment and safety interventions were documented, with consistent hourly rounds and supervised activity.    Infection prevention education was provided and acknowledged, with no liquid or loose stools reported.    CBC, lipid panel, magnesium, and lipase labs were drawn and reviewed, with magnesium low and lipase elevated.    Overall, pain was managed effectively and safety measures were maintained throughout the shift.     Absence of Hospital-Acquired Illness or Injury: Fall prevention strategies were maintained throughout the shift, including safety rounds, supervised activity, and use of nonskid footwear; infection prevention education was provided and acknowledged, and hourly rounds were consistently completed. No liquid or loose stools were reported, and skin protection measures were in place, with minimal risk for pressure injury documented.     Optimal Pain Control and Function: Abdominal pain increased to severe midday but was promptly addressed with morphine , resulting in rapid improvement and return to moderate pain levels for the remainder of the shift. Pain assessments were performed regularly, and pain remained localized to the abdomen.     Electrolyte Balance: Magnesium was low and lipase was elevated on lab results, while blood glucose remained stable and within normal range during the shift. Lipid panel values were within expected limits.     Goal Outcome Evaluation:     Anxieties, Fears or Concerns: Being admitted to hospital. (06/27/24 1154)  Individualized Care Needs: Pain medicine, IV fluids. (06/27/24 1154)  Patient-Specific Goals (Include Timeframe): Improvement in pain. (06/27/24 1154)  Plan of Care Reviewed With: patient (06/27/24 1154)     Patient Progress: improving

## 2024-06-27 NOTE — Assessment & Plan Note (Signed)
-   Placed on Accu-Cheks with very sensitive sliding scale coverage at this time.

## 2024-06-27 NOTE — Assessment & Plan Note (Signed)
-   Hold oral medications at this time.

## 2024-06-27 NOTE — ED Nurses Note (Signed)
 Report called to Health Visitor at [facility]. All questions answered. Patient personal belongings in his position including phone and charging cable. Alert and oriented. Skin warm and dry. Color WNL. Respiration even and non labored. Reports abdominal discomfort remains at a 3 on pain scale. VSS. Denies nausea. 20g SL remains intact clean and dry. No problems noted upon leaving unit.

## 2024-06-28 LAB — POC BLOOD GLUCOSE (RESULTS)
GLUCOSE, POC: 160 mg/dL — ABNORMAL HIGH (ref 70–100)
GLUCOSE, POC: 76 mg/dL (ref 70–100)
GLUCOSE, POC: 84 mg/dL (ref 70–100)
GLUCOSE, POC: 91 mg/dL (ref 70–100)

## 2024-06-28 LAB — CBC
HCT: 36.3 % — ABNORMAL LOW (ref 36.7–47.1)
HGB: 13.1 g/dL (ref 12.5–16.3)
MCH: 30.6 pg (ref 23.8–33.4)
MCHC: 36.2 g/dL (ref 32.5–36.3)
MCV: 84.7 fL (ref 73.0–96.2)
MPV: 10 fL (ref 7.4–11.4)
PLATELETS: 138 10*3/uL — ABNORMAL LOW (ref 140–440)
RBC: 4.29 10*6/uL (ref 4.06–5.63)
RDW: 13.2 % (ref 12.1–16.2)
WBC: 7.3 10*3/uL (ref 3.6–10.2)

## 2024-06-28 LAB — COMPREHENSIVE METABOLIC PANEL, NON-FASTING
ALBUMIN/GLOBULIN RATIO: 1.3 (ref 0.8–1.4)
ALBUMIN: 3.6 g/dL (ref 3.5–5.7)
ALKALINE PHOSPHATASE: 62 U/L (ref 34–104)
ALT (SGPT): 17 U/L (ref 7–52)
ANION GAP: 9 mmol/L (ref 4–13)
AST (SGOT): 17 U/L (ref 13–39)
BILIRUBIN TOTAL: 1 mg/dL (ref 0.3–1.0)
BUN/CREA RATIO: 15 (ref 6–22)
BUN: 14 mg/dL (ref 7–25)
CALCIUM, CORRECTED: 8.6 mg/dL — ABNORMAL LOW (ref 8.9–10.8)
CALCIUM: 8.3 mg/dL — ABNORMAL LOW (ref 8.6–10.3)
CHLORIDE: 101 mmol/L (ref 98–107)
CO2 TOTAL: 27 mmol/L (ref 21–31)
CREATININE: 0.91 mg/dL (ref 0.60–1.30)
ESTIMATED GFR: 100 mL/min/{1.73_m2} (ref >59)
GLOBULIN: 2.8 (ref 2.0–3.5)
GLUCOSE: 85 mg/dL (ref 74–109)
OSMOLALITY, CALCULATED: 274 mosm/kg (ref 270–290)
POTASSIUM: 4 mmol/L (ref 3.5–5.1)
PROTEIN TOTAL: 6.4 g/dL (ref 6.4–8.9)
SODIUM: 137 mmol/L (ref 136–145)

## 2024-06-28 LAB — LIPASE: LIPASE: 119 U/L — ABNORMAL HIGH (ref 11–82)

## 2024-06-28 MED ORDER — DEXTROSE 5 % AND LACTATED RINGERS INTRAVENOUS SOLUTION: INTRAVENOUS | Status: DC

## 2024-06-28 MED ORDER — CARVEDILOL 3.125 MG TABLET: 3.1250 mg | ORAL_TABLET | Freq: Two times a day (BID) | ORAL | 0 refills | Status: AC

## 2024-06-28 NOTE — Nurses Notes (Signed)

## 2024-06-28 NOTE — Discharge Summary (Signed)
 [facility]  DISCHARGE SUMMARY    PATIENT NAME:  Mark Fischer, Mark Fischer  MRN:  [id number]  DOB:  1971-03-01    ENCOUNTER DATE:  06/26/2024  INPATIENT ADMISSION DATE: 06/27/2024  DISCHARGE DATE:  06/28/2024    ATTENDING PHYSICIAN: Faustina Mater, DO.  SERVICE: [facility] HOSPITALIST 1  PRIMARY CARE PHYSICIAN: Tanda LELON Pizza, MD       No lay caregiver identified.    PRIMARY DISCHARGE DIAGNOSIS: Acute pancreatitis  Active Hospital Problems    Diagnosis Date Noted    Principal Problem: Acute pancreatitis [K85.90] 06/27/2024    Diabetes mellitus [E11.9]     Essential hypertension [I10]       Resolved Hospital Problems   No resolved problems to display.     Active Non-Hospital Problems    Diagnosis Date Noted    Pancreatitis 11/27/2023             Current Discharge Medication List        START taking these medications.        Details   carvedilol  3.125 mg Tablet  Commonly known as: COREG    Take 1 Tablet (3.125 mg total) by mouth Twice daily with food  Qty: 60 Tablet  Refills: 0            CONTINUE these medications - NO CHANGES were made during your visit.        Details   ergocalciferol (vitamin D2) 1,250 mcg (50,000 unit) Capsule  Commonly known as: DRISDOL   Take 1 Capsule (50,000 Units total) by mouth Every 7 days  Refills: 0     Januvia 100 mg Tablet  Generic drug: SITagliptin phosphate   Take 1 Tablet (100 mg total) by mouth Every night  Refills: 0     MetFORMIN 1,000 mg Tablet  Commonly known as: GLUCOPHAGE   Take 0.5 Tablets (500 mg total) by mouth Twice daily with food  Refills: 0     omeprazole 20 mg Capsule, Delayed Release(E.C.)  Commonly known as: PRILOSEC   Take 1 Capsule (20 mg total) by mouth Daily  Refills: 0     ondansetron  4 mg Tablet, Rapid Dissolve  Commonly known as: ZOFRAN  ODT   Take 1 Tablet (4 mg total) by mouth Every 8 hours as needed for Nausea/Vomiting  Qty: 12 Tablet  Refills: 0            STOP taking these medications.      hydroCHLOROthiazide 25 mg Tablet  Commonly known as:  HYDRODIURIL     Ibuprofen 800 mg Tablet  Commonly known as: MOTRIN     ketorolac  tromethamine  10 mg Tablet  Commonly known as: TORADOL      losartan  100 mg Tablet  Commonly known as: COZAAR             Discharge med list refreshed?  YES     Allergies[1]  HOSPITAL PROCEDURE(S):   No orders of the defined types were placed in this encounter.      REASON FOR HOSPITALIZATION AND HOSPITAL COURSE   BRIEF HPI:  This is a 54 y.o., male admitted for acute pancreatitis, history of NASH, history of diabetes mellitus type 2, history of hypertension  BRIEF HOSPITAL NARRATIVE: See H&P for admission details    Acute pancreatitis  - Likely related to medications and history of diabetes mellitus type 2, patient has no history of alcohol abuse, lipid panel unremarkable, gallbladder surgically absent  - CT abdomen pelvis with IV contrast showed faint mesenteric fat stranding around  the head and uncinate process of the pancreas  - Patient NPO on admission, tolerated diet on day of discharge  - Has completed therapy with IV fluids  - Discontinue HCTZ and losartan   - Take Coreg  3.125 b.i.d. for blood pressure  - Continue with outpatient follow up with gastroenterologist in [place], [place]      CONDITION ON DISCHARGE:  A. Ambulation: Full ambulation  B. Self-care Ability: Complete  C. Cognitive Status Alert and Oriented x 3  D. Code status at discharge:       LINES/DRAINS/WOUNDS AT DISCHARGE:   Patient Lines/Drains/Airways Status       Active Line / Dialysis Catheter / Dialysis Graft / Drain / Airway / Wound       None                    DISCHARGE DISPOSITION:  Home discharge  DISCHARGE INSTRUCTIONS:  Post-Discharge Follow Up Appointments       Follow up with Elida Tanda ORN, MD    Phone: [phone]    Where: [address] [place] [address]          No discharge procedures on file.       Faustina Mater, DO    Copies sent to Care Team         Relationship Specialty Notifications Start End    Billips, Tanda ORN, MD PCP - General  FAMILY MEDICINE Yes. All results 12/10/23     Phone: [phone] Fax: [phone]         [address] [place] [address]            Referring providers can utilize [email or web address] to access their referred [facility] Medicine patient's information.                               [1] No Known Allergies

## 2024-07-01 LAB — ADULT ROUTINE BLOOD CULTURE, SET OF 2 BOTTLES (BACTERIA AND YEAST)
BLOOD CULTURE, ROUTINE: NO GROWTH
BLOOD CULTURE, ROUTINE: NO GROWTH
